# Patient Record
Sex: Female | Born: 1940 | Race: White | Hispanic: No | Marital: Married | Smoking: Never smoker
Health system: Southern US, Community
[De-identification: ages and names within clinical notes are randomized; demographics above are authoritative.]

## PROBLEM LIST (undated history)

## (undated) DIAGNOSIS — M1990 Unspecified osteoarthritis, unspecified site: Secondary | ICD-10-CM

## (undated) DIAGNOSIS — K5790 Diverticulosis of intestine, part unspecified, without perforation or abscess without bleeding: Secondary | ICD-10-CM

## (undated) DIAGNOSIS — J45909 Unspecified asthma, uncomplicated: Secondary | ICD-10-CM

## (undated) DIAGNOSIS — E039 Hypothyroidism, unspecified: Secondary | ICD-10-CM

## (undated) DIAGNOSIS — T7840XA Allergy, unspecified, initial encounter: Secondary | ICD-10-CM

## (undated) DIAGNOSIS — I739 Peripheral vascular disease, unspecified: Secondary | ICD-10-CM

## (undated) DIAGNOSIS — D126 Benign neoplasm of colon, unspecified: Secondary | ICD-10-CM

## (undated) DIAGNOSIS — M8580 Other specified disorders of bone density and structure, unspecified site: Secondary | ICD-10-CM

## (undated) DIAGNOSIS — E7800 Pure hypercholesterolemia, unspecified: Secondary | ICD-10-CM

## (undated) DIAGNOSIS — E559 Vitamin D deficiency, unspecified: Secondary | ICD-10-CM

## (undated) DIAGNOSIS — I10 Essential (primary) hypertension: Secondary | ICD-10-CM

## (undated) DIAGNOSIS — Z953 Presence of xenogenic heart valve: Secondary | ICD-10-CM

## (undated) HISTORY — PX: TONSILLECTOMY: SUR1361

## (undated) HISTORY — DX: Allergy, unspecified, initial encounter: T78.40XA

## (undated) HISTORY — DX: Pure hypercholesterolemia, unspecified: E78.00

## (undated) HISTORY — DX: Benign neoplasm of colon, unspecified: D12.6

## (undated) HISTORY — DX: Diverticulosis of intestine, part unspecified, without perforation or abscess without bleeding: K57.90

## (undated) HISTORY — PX: MULTIPLE TOOTH EXTRACTIONS: SHX2053

## (undated) HISTORY — PX: COLONOSCOPY W/ POLYPECTOMY: SHX1380

## (undated) HISTORY — DX: Unspecified osteoarthritis, unspecified site: M19.90

## (undated) HISTORY — DX: Vitamin D deficiency, unspecified: E55.9

## (undated) HISTORY — PX: FINGER SURGERY: SHX640

## (undated) HISTORY — DX: Other specified disorders of bone density and structure, unspecified site: M85.80

## (undated) HISTORY — PX: BREAST SURGERY: SHX581

## (undated) HISTORY — DX: Peripheral vascular disease, unspecified: I73.9

---

## 1998-11-17 ENCOUNTER — Encounter: Payer: Self-pay | Admitting: Obstetrics and Gynecology

## 1998-11-17 ENCOUNTER — Ambulatory Visit (HOSPITAL_COMMUNITY): Admission: RE | Admit: 1998-11-17 | Discharge: 1998-11-17 | Payer: Self-pay | Admitting: Obstetrics and Gynecology

## 1999-11-20 ENCOUNTER — Encounter: Payer: Self-pay | Admitting: Obstetrics and Gynecology

## 1999-11-20 ENCOUNTER — Ambulatory Visit (HOSPITAL_COMMUNITY): Admission: RE | Admit: 1999-11-20 | Discharge: 1999-11-20 | Payer: Self-pay | Admitting: Obstetrics and Gynecology

## 2001-01-15 ENCOUNTER — Other Ambulatory Visit: Admission: RE | Admit: 2001-01-15 | Discharge: 2001-01-15 | Payer: Self-pay | Admitting: Obstetrics and Gynecology

## 2001-01-15 ENCOUNTER — Encounter (INDEPENDENT_AMBULATORY_CARE_PROVIDER_SITE_OTHER): Payer: Self-pay | Admitting: Specialist

## 2002-06-01 ENCOUNTER — Ambulatory Visit (HOSPITAL_COMMUNITY): Admission: RE | Admit: 2002-06-01 | Discharge: 2002-06-01 | Payer: Self-pay | Admitting: Family Medicine

## 2002-06-01 ENCOUNTER — Encounter: Payer: Self-pay | Admitting: Family Medicine

## 2002-07-08 ENCOUNTER — Encounter: Payer: Self-pay | Admitting: Family Medicine

## 2002-07-08 ENCOUNTER — Ambulatory Visit (HOSPITAL_COMMUNITY): Admission: RE | Admit: 2002-07-08 | Discharge: 2002-07-08 | Payer: Self-pay | Admitting: Family Medicine

## 2007-06-01 ENCOUNTER — Encounter: Admission: RE | Admit: 2007-06-01 | Discharge: 2007-06-01 | Payer: Self-pay | Admitting: Family Medicine

## 2007-11-26 ENCOUNTER — Emergency Department: Payer: Self-pay | Admitting: Emergency Medicine

## 2007-12-04 ENCOUNTER — Emergency Department: Payer: Self-pay | Admitting: Emergency Medicine

## 2011-12-31 DIAGNOSIS — E559 Vitamin D deficiency, unspecified: Secondary | ICD-10-CM

## 2011-12-31 HISTORY — DX: Vitamin D deficiency, unspecified: E55.9

## 2012-12-30 DIAGNOSIS — M1990 Unspecified osteoarthritis, unspecified site: Secondary | ICD-10-CM

## 2012-12-30 HISTORY — DX: Unspecified osteoarthritis, unspecified site: M19.90

## 2013-08-17 ENCOUNTER — Other Ambulatory Visit: Payer: Self-pay | Admitting: Orthopedic Surgery

## 2013-08-27 ENCOUNTER — Encounter (HOSPITAL_COMMUNITY): Payer: Self-pay | Admitting: Pharmacy Technician

## 2013-08-27 NOTE — Pre-Procedure Instructions (Signed)
MARKEIA HARKLESS  08/27/2013   Your procedure is scheduled on: Monday September 06, 2013 at 7:30 AM  Report to [facility] at 5:30 AM.  Call this number if you have problems the morning of surgery: [phone]   Remember:   Do not eat food or drink liquids after midnight.   Take these medicines the morning of surgery with A SIP OF WATER: levothyroxine (SYNTHROID, LEVOTHROID)  Stop all Vitamins, Herbal Medications, Aspirin, and Non Steroidals (Lodine(Etodolac), Ibuprofen, Motrin, Aleve, Naproxen) as of today - 08/31/13   Do not wear jewelry, make-up or nail polish.  Do not wear lotions, powders, or perfumes. You may wear deodorant.  Do not shave 48 hours prior to surgery.   Do not bring valuables to the hospital.  [facility] is not responsible for any belongings or valuables.  Contacts, dentures or bridgework may not be worn into surgery.  Leave suitcase in the car. After surgery it may be brought to your room.  For patients admitted to the hospital, checkout time is 11:00 AM the day of discharge.    Special Instructions: Shower using CHG 2 nights before surgery and the night before surgery.  If you shower the day of surgery use CHG.  Use special wash - you have one bottle of CHG for all showers.  You should use approximately 1/3 of the bottle for each shower.   Please read over the following fact sheets that you were given: Pain Booklet, Coughing and Deep Breathing, Blood Transfusion Information, Total Joint Packet, MRSA Information and Surgical Site Infection Prevention

## 2013-08-31 ENCOUNTER — Ambulatory Visit (HOSPITAL_COMMUNITY)
Admission: RE | Admit: 2013-08-31 | Discharge: 2013-08-31 | Disposition: A | Discharge status: Home / Self Care | Payer: Medicare Other | Source: Ambulatory Visit | Attending: Orthopedic Surgery | Admitting: Orthopedic Surgery

## 2013-08-31 ENCOUNTER — Encounter (HOSPITAL_COMMUNITY)
Admission: RE | Admit: 2013-08-31 | Discharge: 2013-08-31 | Disposition: A | Discharge status: Home / Self Care | Payer: Medicare Other | Source: Ambulatory Visit | Attending: Orthopedic Surgery | Admitting: Orthopedic Surgery

## 2013-08-31 ENCOUNTER — Encounter (HOSPITAL_COMMUNITY): Payer: Self-pay

## 2013-08-31 DIAGNOSIS — Z01812 Encounter for preprocedural laboratory examination: Secondary | ICD-10-CM | POA: Insufficient documentation

## 2013-08-31 DIAGNOSIS — I700 Atherosclerosis of aorta: Secondary | ICD-10-CM | POA: Insufficient documentation

## 2013-08-31 DIAGNOSIS — M169 Osteoarthritis of hip, unspecified: Secondary | ICD-10-CM | POA: Insufficient documentation

## 2013-08-31 DIAGNOSIS — I498 Other specified cardiac arrhythmias: Secondary | ICD-10-CM | POA: Insufficient documentation

## 2013-08-31 DIAGNOSIS — Z01810 Encounter for preprocedural cardiovascular examination: Secondary | ICD-10-CM | POA: Insufficient documentation

## 2013-08-31 DIAGNOSIS — M1610 Unilateral primary osteoarthritis, unspecified hip: Secondary | ICD-10-CM | POA: Insufficient documentation

## 2013-08-31 HISTORY — DX: Essential (primary) hypertension: I10

## 2013-08-31 HISTORY — DX: Unspecified osteoarthritis, unspecified site: M19.90

## 2013-08-31 HISTORY — DX: Hypothyroidism, unspecified: E03.9

## 2013-08-31 HISTORY — DX: Unspecified asthma, uncomplicated: J45.909

## 2013-08-31 LAB — CBC WITH DIFFERENTIAL/PLATELET
Basophils Relative: 1 % (ref 0–1)
Eosinophils Absolute: 0.1 10*3/uL (ref 0.0–0.7)
HCT: 40.9 % (ref 36.0–46.0)
Hemoglobin: 14.3 g/dL (ref 12.0–15.0)
Lymphs Abs: 1 10*3/uL (ref 0.7–4.0)
MCH: 32.1 pg (ref 26.0–34.0)
MCHC: 35 g/dL (ref 30.0–36.0)
Monocytes Absolute: 0.5 10*3/uL (ref 0.1–1.0)
Monocytes Relative: 8 % (ref 3–12)
Neutro Abs: 4.9 10*3/uL (ref 1.7–7.7)
Neutrophils Relative %: 74 % (ref 43–77)
RBC: 4.46 MIL/uL (ref 3.87–5.11)

## 2013-08-31 LAB — URINALYSIS, ROUTINE W REFLEX MICROSCOPIC
Glucose, UA: NEGATIVE mg/dL
Ketones, ur: NEGATIVE mg/dL
Leukocytes, UA: NEGATIVE
Nitrite: NEGATIVE
Specific Gravity, Urine: 1.008 (ref 1.005–1.030)
pH: 7 (ref 5.0–8.0)

## 2013-08-31 LAB — BASIC METABOLIC PANEL
BUN: 14 mg/dL (ref 6–23)
Chloride: 100 mEq/L (ref 96–112)
Glucose, Bld: 100 mg/dL — ABNORMAL HIGH (ref 70–99)
Potassium: 3.6 mEq/L (ref 3.5–5.1)
Sodium: 138 mEq/L (ref 135–145)

## 2013-08-31 LAB — PROTIME-INR: INR: 0.99 (ref 0.00–1.49)

## 2013-08-31 LAB — ABO/RH: ABO/RH(D): O POS

## 2013-08-31 LAB — TYPE AND SCREEN: Antibody Screen: NEGATIVE

## 2013-08-31 LAB — SURGICAL PCR SCREEN: MRSA, PCR: NEGATIVE

## 2013-08-31 NOTE — Progress Notes (Signed)
Patient denied having any cardiac or pulmonary issues. PCP is Dr. Robina Ade at [facility] at [place].

## 2013-09-01 NOTE — Consult Note (Signed)
Anesthesiology Chart Review:  72 year female with hypertension on HCTZ. Denies DM or heart disease. Scheduled for R. THR on 09/06/13 by Dr Turner Daniels. Pre-op ECG shows possible loss of R wave progression in lead V3. I suspect that this is a lead placement artifact. Will repeat ECG on AM of surgery.  She will be evaluated by her assigned anesthesiologist on the day of surgery.  Kipp Brood, MD

## 2013-09-03 NOTE — H&P (Signed)
Cheyenne Jones is an 72 y.o. female.   Chief Complaint:  Right Hip Pain  HPI: Patient reports that the cortisone injection given by Dr. Regino Jones into the right hip 2 weeks ago lasted for only an hour or two.  She still has a profound right-sided limp.  Pain wakes her at night and interferes with doing chores around the house and at age 32 she has had some near falling episodes.  Her x-rays have shown moderate to severe arthritis of loss of articular cartilage height to the right hip, and impressive drop osteophyte and near bone-on-bone to the medial pole.  Past Medical History  Diagnosis Date  . Hypertension   . Bronchitis, asthmatic   . Hypothyroidism   . Arthritis     Past Surgical History  Procedure Laterality Date  . Tonsillectomy    . Breast surgery Right     x 2  . Finger surgery Right     index finger  . Colonoscopy w/ polypectomy      No family history on file. Social History:  reports that she has never smoked. She does not have any smokeless tobacco history on file. She reports that she drinks about 1.2 ounces of alcohol per week. She reports that she does not use illicit drugs.  Allergies: No Known Allergies  No prescriptions prior to admission    No results found for this or any previous visit (from the past 48 hour(s)). No results found.  Review of Systems  Constitutional: Negative.   HENT: Negative.   Eyes: Positive for blurred vision (pt wears contacts).  Cardiovascular: Negative.   Gastrointestinal: Negative.   Genitourinary: Negative.   Musculoskeletal: Positive for joint pain (right hip pain).  Skin: Negative.   Neurological: Negative.   Endo/Heme/Allergies: Negative.   Psychiatric/Behavioral: Negative.     There were no vitals taken for this visit. Physical Exam  Constitutional: She is oriented to person, place, and time. She appears well-developed and well-nourished.  HENT:  Head: Normocephalic and atraumatic.  Neck: Normal range of motion. Neck  supple.  Cardiovascular: Intact distal pulses.   Respiratory: Effort normal and breath sounds normal.  Neurological: She is alert and oriented to person, place, and time. She has normal reflexes.  Skin: Skin is warm and dry.  Psychiatric: She has a normal mood and affect. Her behavior is normal. Judgment and thought content normal.     Assessment/Plan Assess: End-stage arthritis of the right hip having failed all conservative measures including a cortisone and anesthetic shot gave only an hour to his worth of pain relief and a 72 year old woman.  She is having some near falling episodes.  Plan: The risks and benefits of hip arthroplasty were discussed at length.  We'll proceed with hip replacement using an S-ROM stem, which will provide the ability for full weightbearing immediately [name].  I will see her back at the time of surgical intervention.  She is still taking 1 or 2 hydrocodone a day which is reasonable.  She may return sooner she has any increased pain.  Cheyenne Jones R 09/03/2013, 1:15 PM

## 2013-09-05 DIAGNOSIS — M1610 Unilateral primary osteoarthritis, unspecified hip: Secondary | ICD-10-CM | POA: Diagnosis present

## 2013-09-05 MED ORDER — CEFAZOLIN SODIUM-DEXTROSE 2-3 GM-% IV SOLR
2.0000 g | INTRAVENOUS | Status: AC
  Administered 2013-09-06: 2 g via INTRAVENOUS
  Filled 2013-09-05: qty 50

## 2013-09-06 ENCOUNTER — Inpatient Hospital Stay (HOSPITAL_COMMUNITY)
Admission: RE | Admit: 2013-09-06 | Discharge: 2013-09-08 | DRG: 470 | Disposition: A | Discharge status: Skilled Nursing Facility | Payer: Medicare Other | Source: Ambulatory Visit | Attending: Orthopedic Surgery | Admitting: Orthopedic Surgery

## 2013-09-06 ENCOUNTER — Encounter (HOSPITAL_COMMUNITY): Payer: Self-pay | Admitting: *Deleted

## 2013-09-06 ENCOUNTER — Encounter (HOSPITAL_COMMUNITY): Payer: Self-pay | Admitting: Anesthesiology

## 2013-09-06 ENCOUNTER — Encounter (HOSPITAL_COMMUNITY)
Admission: RE | Disposition: A | Discharge status: Skilled Nursing Facility | Payer: Self-pay | Source: Ambulatory Visit | Attending: Orthopedic Surgery

## 2013-09-06 ENCOUNTER — Inpatient Hospital Stay (HOSPITAL_COMMUNITY): Payer: Medicare Other | Admitting: Anesthesiology

## 2013-09-06 ENCOUNTER — Inpatient Hospital Stay (HOSPITAL_COMMUNITY): Payer: Medicare Other

## 2013-09-06 DIAGNOSIS — Z7982 Long term (current) use of aspirin: Secondary | ICD-10-CM

## 2013-09-06 DIAGNOSIS — I10 Essential (primary) hypertension: Secondary | ICD-10-CM | POA: Diagnosis present

## 2013-09-06 DIAGNOSIS — Z79899 Other long term (current) drug therapy: Secondary | ICD-10-CM

## 2013-09-06 DIAGNOSIS — Z23 Encounter for immunization: Secondary | ICD-10-CM

## 2013-09-06 DIAGNOSIS — M169 Osteoarthritis of hip, unspecified: Principal | ICD-10-CM | POA: Diagnosis present

## 2013-09-06 DIAGNOSIS — M1610 Unilateral primary osteoarthritis, unspecified hip: Principal | ICD-10-CM | POA: Diagnosis present

## 2013-09-06 DIAGNOSIS — E039 Hypothyroidism, unspecified: Secondary | ICD-10-CM | POA: Diagnosis present

## 2013-09-06 HISTORY — PX: TOTAL HIP ARTHROPLASTY: SHX124

## 2013-09-06 SURGERY — ARTHROPLASTY, HIP, TOTAL,POSTERIOR APPROACH
Anesthesia: General | Site: Hip | Laterality: Right | Wound class: Clean

## 2013-09-06 MED ORDER — PROPOFOL 10 MG/ML IV BOLUS
INTRAVENOUS | Status: DC | PRN
  Administered 2013-09-06: 100 mg via INTRAVENOUS

## 2013-09-06 MED ORDER — INFLUENZA VIRUS VACC SPLIT PF IM SUSP: 0.5000 mL | INTRAMUSCULAR | Status: DC

## 2013-09-06 MED ORDER — DEXTROSE-NACL 5-0.45 % IV SOLN: INTRAVENOUS | Status: DC

## 2013-09-06 MED ORDER — TRANEXAMIC ACID 100 MG/ML IV SOLN
1000.0000 mg | INTRAVENOUS | Status: AC
  Administered 2013-09-06: 1000 mg via INTRAVENOUS
  Filled 2013-09-06: qty 10

## 2013-09-06 MED ORDER — ARTIFICIAL TEARS OP OINT
TOPICAL_OINTMENT | OPHTHALMIC | Status: DC | PRN
  Administered 2013-09-06: 1 via OPHTHALMIC

## 2013-09-06 MED ORDER — FLEET ENEMA 7-19 GM/118ML RE ENEM: 1.0000 | ENEMA | Freq: Once | RECTAL | Status: AC | PRN

## 2013-09-06 MED ORDER — ROCURONIUM BROMIDE 100 MG/10ML IV SOLN
INTRAVENOUS | Status: DC | PRN
  Administered 2013-09-06: 50 mg via INTRAVENOUS

## 2013-09-06 MED ORDER — SENNOSIDES-DOCUSATE SODIUM 8.6-50 MG PO TABS: 1.0000 | ORAL_TABLET | Freq: Every evening | ORAL | Status: DC | PRN

## 2013-09-06 MED ORDER — NEOSTIGMINE METHYLSULFATE 1 MG/ML IJ SOLN
INTRAMUSCULAR | Status: DC | PRN
  Administered 2013-09-06: 4 mg via INTRAVENOUS

## 2013-09-06 MED ORDER — PHENOL 1.4 % MT LIQD: 1.0000 | OROMUCOSAL | Status: DC | PRN

## 2013-09-06 MED ORDER — TIZANIDINE HCL 2 MG PO TABS: 2.0000 mg | ORAL_TABLET | Freq: Four times a day (QID) | ORAL | Status: DC | PRN

## 2013-09-06 MED ORDER — CHLORHEXIDINE GLUCONATE 4 % EX LIQD: 60.0000 mL | Freq: Once | CUTANEOUS | Status: DC

## 2013-09-06 MED ORDER — LEVOTHYROXINE SODIUM 88 MCG PO TABS
88.0000 ug | ORAL_TABLET | Freq: Every day | ORAL | Status: DC
  Administered 2013-09-07 – 2013-09-08 (×2): 88 ug via ORAL
  Filled 2013-09-06 (×3): qty 1

## 2013-09-06 MED ORDER — OXYCODONE-ACETAMINOPHEN 5-325 MG PO TABS: 1.0000 | ORAL_TABLET | ORAL | Status: DC | PRN

## 2013-09-06 MED ORDER — BUPIVACAINE-EPINEPHRINE (PF) 0.5% -1:200000 IJ SOLN
INTRAMUSCULAR | Status: AC
  Filled 2013-09-06: qty 10

## 2013-09-06 MED ORDER — GLYCOPYRROLATE 0.2 MG/ML IJ SOLN
INTRAMUSCULAR | Status: DC | PRN
  Administered 2013-09-06: 0.6 mg via INTRAVENOUS

## 2013-09-06 MED ORDER — HYDROMORPHONE HCL PF 1 MG/ML IJ SOLN
0.5000 mg | INTRAMUSCULAR | Status: DC | PRN
  Administered 2013-09-06 (×3): 0.5 mg via INTRAVENOUS
  Filled 2013-09-06 (×3): qty 1

## 2013-09-06 MED ORDER — METHOCARBAMOL 100 MG/ML IJ SOLN
500.0000 mg | Freq: Once | INTRAVENOUS | Status: AC
  Administered 2013-09-06: 500 mg via INTRAVENOUS
  Filled 2013-09-06: qty 5

## 2013-09-06 MED ORDER — METHOCARBAMOL 100 MG/ML IJ SOLN
500.0000 mg | Freq: Four times a day (QID) | INTRAVENOUS | Status: DC | PRN
  Filled 2013-09-06: qty 5

## 2013-09-06 MED ORDER — INFLUENZA VAC SPLIT QUAD 0.5 ML IM SUSP
0.5000 mL | INTRAMUSCULAR | Status: AC
  Filled 2013-09-06 (×2): qty 0.5

## 2013-09-06 MED ORDER — SODIUM CHLORIDE 0.9 % IR SOLN
Status: DC | PRN
  Administered 2013-09-06: 1000 mL

## 2013-09-06 MED ORDER — OXYCODONE HCL 5 MG PO TABS
5.0000 mg | ORAL_TABLET | ORAL | Status: DC | PRN
  Administered 2013-09-07 – 2013-09-08 (×4): 10 mg via ORAL
  Filled 2013-09-06 (×4): qty 2

## 2013-09-06 MED ORDER — METOCLOPRAMIDE HCL 5 MG/ML IJ SOLN: 5.0000 mg | Freq: Three times a day (TID) | INTRAMUSCULAR | Status: DC | PRN

## 2013-09-06 MED ORDER — DIPHENHYDRAMINE HCL 12.5 MG/5ML PO ELIX: 12.5000 mg | ORAL_SOLUTION | ORAL | Status: DC | PRN

## 2013-09-06 MED ORDER — HYDROMORPHONE HCL PF 1 MG/ML IJ SOLN
INTRAMUSCULAR | Status: AC
  Filled 2013-09-06: qty 1

## 2013-09-06 MED ORDER — METHOCARBAMOL 500 MG PO TABS: 500.0000 mg | ORAL_TABLET | Freq: Four times a day (QID) | ORAL | Status: DC | PRN

## 2013-09-06 MED ORDER — HYDROMORPHONE HCL PF 1 MG/ML IJ SOLN
0.2500 mg | INTRAMUSCULAR | Status: DC | PRN
  Administered 2013-09-06 (×4): 0.5 mg via INTRAVENOUS

## 2013-09-06 MED ORDER — ACETAMINOPHEN 325 MG PO TABS: 650.0000 mg | ORAL_TABLET | Freq: Four times a day (QID) | ORAL | Status: DC | PRN

## 2013-09-06 MED ORDER — ONDANSETRON HCL 4 MG PO TABS: 4.0000 mg | ORAL_TABLET | Freq: Four times a day (QID) | ORAL | Status: DC | PRN

## 2013-09-06 MED ORDER — LIDOCAINE HCL (CARDIAC) 20 MG/ML IV SOLN
INTRAVENOUS | Status: DC | PRN
  Administered 2013-09-06: 50 mg via INTRAVENOUS

## 2013-09-06 MED ORDER — FENTANYL CITRATE 0.05 MG/ML IJ SOLN
INTRAMUSCULAR | Status: DC | PRN
  Administered 2013-09-06: 100 ug via INTRAVENOUS
  Administered 2013-09-06 (×2): 50 ug via INTRAVENOUS

## 2013-09-06 MED ORDER — ONDANSETRON HCL 4 MG/2ML IJ SOLN: 4.0000 mg | Freq: Four times a day (QID) | INTRAMUSCULAR | Status: DC | PRN

## 2013-09-06 MED ORDER — METOCLOPRAMIDE HCL 10 MG PO TABS: 5.0000 mg | ORAL_TABLET | Freq: Three times a day (TID) | ORAL | Status: DC | PRN

## 2013-09-06 MED ORDER — DOCUSATE SODIUM 100 MG PO CAPS
100.0000 mg | ORAL_CAPSULE | Freq: Two times a day (BID) | ORAL | Status: DC
  Administered 2013-09-06 – 2013-09-08 (×5): 100 mg via ORAL
  Filled 2013-09-06 (×6): qty 1

## 2013-09-06 MED ORDER — MENTHOL 3 MG MT LOZG: 1.0000 | LOZENGE | OROMUCOSAL | Status: DC | PRN

## 2013-09-06 MED ORDER — HYDROCHLOROTHIAZIDE 25 MG PO TABS
25.0000 mg | ORAL_TABLET | Freq: Every day | ORAL | Status: DC
  Administered 2013-09-06 – 2013-09-08 (×3): 25 mg via ORAL
  Filled 2013-09-06 (×4): qty 1

## 2013-09-06 MED ORDER — ONDANSETRON HCL 4 MG/2ML IJ SOLN
INTRAMUSCULAR | Status: DC | PRN
  Administered 2013-09-06: 4 mg via INTRAVENOUS

## 2013-09-06 MED ORDER — BISACODYL 5 MG PO TBEC: 5.0000 mg | DELAYED_RELEASE_TABLET | Freq: Every day | ORAL | Status: DC | PRN

## 2013-09-06 MED ORDER — ACETAMINOPHEN 650 MG RE SUPP: 650.0000 mg | Freq: Four times a day (QID) | RECTAL | Status: DC | PRN

## 2013-09-06 MED ORDER — KCL IN DEXTROSE-NACL 20-5-0.45 MEQ/L-%-% IV SOLN
INTRAVENOUS | Status: DC
  Administered 2013-09-06: 16:00:00 via INTRAVENOUS
  Administered 2013-09-07: 1000 mL via INTRAVENOUS
  Administered 2013-09-07: 08:00:00 via INTRAVENOUS
  Filled 2013-09-06 (×9): qty 1000

## 2013-09-06 MED ORDER — ASPIRIN EC 325 MG PO TBEC
325.0000 mg | DELAYED_RELEASE_TABLET | Freq: Every day | ORAL | Status: DC
  Administered 2013-09-07 – 2013-09-08 (×2): 325 mg via ORAL
  Filled 2013-09-06 (×3): qty 1

## 2013-09-06 MED ORDER — LACTATED RINGERS IV SOLN
INTRAVENOUS | Status: DC | PRN
  Administered 2013-09-06: 07:00:00 via INTRAVENOUS

## 2013-09-06 MED ORDER — BUPIVACAINE-EPINEPHRINE 0.5% -1:200000 IJ SOLN
INTRAMUSCULAR | Status: DC | PRN
  Administered 2013-09-06: 18 mL

## 2013-09-06 SURGICAL SUPPLY — 57 items
BLADE SAW SGTL 18X1.27X75 (BLADE) ×2 IMPLANT
BRUSH FEMORAL CANAL (MISCELLANEOUS) IMPLANT
CAPT HIP PF MOP ×1 IMPLANT
CLOTH BEACON ORANGE TIMEOUT ST (SAFETY) ×2 IMPLANT
COVER BACK TABLE 24X17X13 BIG (DRAPES) IMPLANT
COVER SURGICAL LIGHT HANDLE (MISCELLANEOUS) ×3 IMPLANT
DRAPE ORTHO SPLIT 77X108 STRL (DRAPES) ×2
DRAPE PROXIMA HALF (DRAPES) ×2 IMPLANT
DRAPE SURG ORHT 6 SPLT 77X108 (DRAPES) ×1 IMPLANT
DRAPE U-SHAPE 47X51 STRL (DRAPES) ×2 IMPLANT
DRILL BIT 7/64X5 (BIT) ×2 IMPLANT
DRSG AQUACEL AG ADV 3.5X10 (GAUZE/BANDAGES/DRESSINGS) ×1 IMPLANT
DRSG MEPILEX BORDER 4X12 (GAUZE/BANDAGES/DRESSINGS) ×1 IMPLANT
DRSG MEPILEX BORDER 4X8 (GAUZE/BANDAGES/DRESSINGS) ×2 IMPLANT
DURAPREP 26ML APPLICATOR (WOUND CARE) ×2 IMPLANT
ELECT BLADE 4.0 EZ CLEAN MEGAD (MISCELLANEOUS)
ELECT REM PT RETURN 9FT ADLT (ELECTROSURGICAL) ×2
ELECTRODE BLDE 4.0 EZ CLN MEGD (MISCELLANEOUS) IMPLANT
ELECTRODE REM PT RTRN 9FT ADLT (ELECTROSURGICAL) ×1 IMPLANT
GAUZE XEROFORM 1X8 LF (GAUZE/BANDAGES/DRESSINGS) ×2 IMPLANT
GLOVE BIO SURGEON STRL SZ7.5 (GLOVE) ×4 IMPLANT
GLOVE BIO SURGEON STRL SZ8.5 (GLOVE) ×4 IMPLANT
GLOVE BIOGEL PI IND STRL 7.0 (GLOVE) IMPLANT
GLOVE BIOGEL PI IND STRL 8 (GLOVE) ×2 IMPLANT
GLOVE BIOGEL PI IND STRL 9 (GLOVE) ×1 IMPLANT
GLOVE BIOGEL PI INDICATOR 7.0 (GLOVE) ×2
GLOVE BIOGEL PI INDICATOR 8 (GLOVE) ×4
GLOVE BIOGEL PI INDICATOR 9 (GLOVE) ×1
GLOVE SURG SS PI 6.5 STRL IVOR (GLOVE) ×1 IMPLANT
GOWN PREVENTION PLUS XLARGE (GOWN DISPOSABLE) ×4 IMPLANT
GOWN STRL NON-REIN LRG LVL3 (GOWN DISPOSABLE) ×4 IMPLANT
GOWN STRL REIN XL XLG (GOWN DISPOSABLE) ×4 IMPLANT
HANDPIECE INTERPULSE COAX TIP (DISPOSABLE)
HOOD PEEL AWAY FACE SHEILD DIS (HOOD) ×5 IMPLANT
KIT BASIN OR (CUSTOM PROCEDURE TRAY) ×2 IMPLANT
KIT ROOM TURNOVER OR (KITS) ×2 IMPLANT
MANIFOLD NEPTUNE II (INSTRUMENTS) ×2 IMPLANT
NEEDLE 22X1 1/2 (OR ONLY) (NEEDLE) ×2 IMPLANT
NS IRRIG 1000ML POUR BTL (IV SOLUTION) ×2 IMPLANT
PACK TOTAL JOINT (CUSTOM PROCEDURE TRAY) ×2 IMPLANT
PAD ARMBOARD 7.5X6 YLW CONV (MISCELLANEOUS) ×4 IMPLANT
PASSER SUT SWANSON 36MM LOOP (INSTRUMENTS) ×2 IMPLANT
PRESSURIZER FEMORAL UNIV (MISCELLANEOUS) IMPLANT
SET HNDPC FAN SPRY TIP SCT (DISPOSABLE) IMPLANT
SUT ETHIBOND 2 V 37 (SUTURE) ×2 IMPLANT
SUT ETHILON 3 0 FSL (SUTURE) ×2 IMPLANT
SUT VIC AB 0 CTB1 27 (SUTURE) ×2 IMPLANT
SUT VIC AB 1 CTX 36 (SUTURE) ×2
SUT VIC AB 1 CTX36XBRD ANBCTR (SUTURE) ×1 IMPLANT
SUT VIC AB 2-0 CTB1 (SUTURE) ×2 IMPLANT
SYR CONTROL 10ML LL (SYRINGE) ×2 IMPLANT
TOWEL OR 17X24 6PK STRL BLUE (TOWEL DISPOSABLE) ×2 IMPLANT
TOWEL OR 17X26 10 PK STRL BLUE (TOWEL DISPOSABLE) ×2 IMPLANT
TOWER CARTRIDGE SMART MIX (DISPOSABLE) IMPLANT
TRAY FOLEY CATH 14FR (SET/KITS/TRAYS/PACK) ×1 IMPLANT
TRAY FOLEY CATH 16FRSI W/METER (SET/KITS/TRAYS/PACK) IMPLANT
WATER STERILE IRR 1000ML POUR (IV SOLUTION) ×8 IMPLANT

## 2013-09-06 NOTE — Op Note (Signed)
OPERATIVE REPORT    DATE OF PROCEDURE:  09/06/2013       PREOPERATIVE DIAGNOSIS:  OSTEOARTHRITIS RIGHT HIP                                                          POSTOPERATIVE DIAGNOSIS:  OSTEOARTHRITIS RIGHT HIP                                                           PROCEDURE:  R total hip arthroplasty using a 50 mm DePuy Pinnacle  Cup, [employer], 10-degree polyethylene liner index superior  and posterior, a +0 32 mm ceramic head, a 16x11x36x150 SROM stem, 16BL Sleeve   SURGEON: Yuta Cipollone J    ASSISTANT:   Eric K. [employer]  (present throughout entire procedure and necessary for timely completion of the procedure)   ANESTHESIA: General BLOOD LOSS: 400 FLUID REPLACEMENT: 1500 crystalloid DRAINS: Foley Catheter URINE OUTPUT: 300cc COMPLICATIONS: none    INDICATIONS FOR PROCEDURE: A 72 y.o. year-old With  OSTEOARTHRITIS RIGHT HIP   for 2 years, x-rays show bone-on-bone arthritic changes. Despite conservative measures with observation, anti-inflammatory medicine, narcotics, use of a cane, has severe unremitting pain and can ambulate only a few blocks before resting.  Patient desires elective R total hip arthroplasty to decrease pain and increase function. The risks, benefits, and alternatives were discussed at length including but not limited to the risks of infection, bleeding, nerve injury, stiffness, blood clots, the need for revision surgery, cardiopulmonary complications, among others, and they were willing to proceed. Questions answered     PROCEDURE IN DETAIL: The patient was identified by armband,  received preoperative IV antibiotics in the holding area at [facility], taken to the operating room , appropriate anesthetic monitors  were attached and general endotracheal anesthesia induced. Foley catheter was inserted. Pt was rolled into the L lateral decubitus position and fixed there with a Stulberg Mark II pelvic clamp.  The R lower extremity was  then prepped and draped  in the usual sterile fashion from the ankle to the hemipelvis. A time-out  procedure was performed. The skin along the lateral hip and thigh  infiltrated with 10 mL of 0.5% Marcaine and epinephrine solution. We  then made a posterolateral approach to the hip. With a #10 blade, a 15 cm  incision was made through the skin and subcutaneous tissue down to the level of the  IT band. Small bleeders were identified and cauterized. The IT band was cut in  line with skin incision exposing the greater trochanter. A Cobra retractor was placed between the gluteus minimus and the superior hip joint capsule, and a spiked Cobra between the quadratus femoris and the inferior hip joint capsule. This isolated the short  external rotators and piriformis tendons. These were tagged with a #2 Ethibond  suture and cut off their insertion on the intertrochanteric crest. The posterior  capsule was then developed into an acetabular-based flap from Posterior Superior off of the acetabulum out over the femoral neck and back posterior inferior to the acetabular rim. This flap was tagged with two #2 Ethibond  sutures and retracted protecting the sciatic nerve. This exposed the arthritic femoral head and osteophytes. The hip was then flexed and internally rotated, dislocating the femoral head and a standard neck cut performed 1 fingerbreadth above the lesser trochanter.  A spiked Cobra was placed in the cotyloid notch and a Hohmann retractor was then used to lever the femur anteriorly off of the anterior pelvic column. A posterior-inferior wing retractor was placed at the junction of the acetabulum and the ischium completing the acetabular exposure.We then removed the peripheral osteophytes and labrum from the acetabulum. We then reamed the acetabulum up to 49 mm with basket reamers obtaining good coverage in all quadrants. We then irrigated with normal  saline solution and hammered into place a 50 mm pinnacle  cup in 45  degrees of abduction and about 20 degrees of anteversion. More  peripheral osteophytes removed and a trial 10-degree liner placed with the  index superior-posterior. The hip was then flexed and internally rotated exposing the  proximal femur, which was entered with the initiating reamer followed by  the axial reamers up to a 11.5 mm full depth and 12mm partial depth. We then conically reamed to 16B to the correct depth for a 42 base neck. The calcar was milled to 16BL. A trial cone and stem was inserted in the 25 degrees anteversion, with a +0 32mm trial head. Trial reduction was then performed and excellent stability was noted with at 90 of flexion with 75 of internal rotation and then full extension with maximal external rotation. The hip could not be dislocated in full extension. The knee could easily flex  to about 130 degrees. We also stretched the abductors at this point,  because of the preexisting adductor contractures. All trial components  were then removed. The acetabulum was irrigated out with normal saline  solution. A titanium Apex [facility] was then screwed into place  followed by a 10-degree polyethylene liner index superior-posterior. On  the femoral side a 16BL ZTT1 sleeve was hammered into place, followed by a 16BL SROM stem in 25 degrees of anteversion. At this point, a +0 32 mm ceramic head was  hammered on the stem. The hip was reduced. We checked our stability  one more time and found it to be excellent. The wound was once again  thoroughly irrigated out with normal saline solution pulse lavage. The  capsular flap and short external rotators were repaired back to the  intertrochanteric crest through drill holes with a #2 Ethibond suture.  The IT band was closed with running 1 Vicryl suture. The subcutaneous  tissue with 0 and 2-0 undyed Vicryl suture and the skin with running  interlocking 3-0 nylon suture. Dressing of Xeroform and Mepilex was  then  applied. The patient was then unclamped, rolled supine, awaken extubated and taken to recovery room without difficulty in stable condition.   Itai Barbian J 09/06/2013, 9:05 AM

## 2013-09-06 NOTE — Anesthesia Postprocedure Evaluation (Signed)
  Anesthesia Post-op Note  Patient: Cheyenne Jones  Procedure(s) Performed: Procedure(s): TOTAL HIP ARTHROPLASTY (Right)  Patient Location: PACU  Anesthesia Type:General  Level of Consciousness: awake and alert   Airway and Oxygen Therapy: Patient Spontanous Breathing  Post-op Pain: mild  Post-op Assessment: Post-op Vital signs reviewed, Patient's Cardiovascular Status Stable, Respiratory Function Stable, Patent Airway and No signs of Nausea or vomiting  Post-op Vital Signs: Reviewed and stable  Complications: No apparent anesthesia complications

## 2013-09-06 NOTE — Evaluation (Signed)
Physical Therapy Evaluation Patient Details Name: Cheyenne Jones MRN: [id number] DOB: Apr 24, 1941 Today's Date: 09/06/2013 Time: 8295-6213 PT Time Calculation (min): 23 min  PT Assessment / Plan / Recommendation History of Present Illness  Patient is a 72 yo female s/p Rt. THA.  Clinical Impression  Patient presents with problems listed below.  Will benefit from acute PT to maximize independence prior to discharge.      PT Assessment  Patient needs continued PT services    Follow Up Recommendations  SNF    Does the patient have the potential to tolerate intense rehabilitation      Barriers to Discharge Inaccessible home environment Lives in split-level home with bed/bath up 4-5 steps.    Equipment Recommendations  3in1 (PT)    Recommendations for Other Services     Frequency 7X/week    Precautions / Restrictions Precautions Precautions: Posterior Hip Precaution Booklet Issued: Yes (comment) Precaution Comments: Reviewed posterior hip precautions with patient and daughter. Restrictions Weight Bearing Restrictions: Yes RLE Weight Bearing: Weight bearing as tolerated   Pertinent Vitals/Pain       Mobility  Bed Mobility Bed Mobility: Supine to Sit;Sitting - Scoot to Edge of Bed Supine to Sit: 3: Mod assist;HOB flat Sitting - Scoot to Edge of Bed: 4: Min assist;With rail Details for Bed Mobility Assistance: Verbal cues for technique to maintain hip precautions.  Assist to move RLE off of bed and to raise trunk to sitting. Transfers Transfers: Sit to Stand;Stand to [employer] Transfers Sit to Stand: 4: Min assist;With upper extremity assist;From bed Stand to Sit: 4: Min assist;With upper extremity assist;With armrests;To chair/3-in-1 Stand Pivot Transfers: 4: Min assist Details for Transfer Assistance: Verbal cues for hand placement, and placement of RLE during transitions.  Assist to rise to standing and for balance.  Patient able to take several steps to pivot to  chair - cues to use UE's on RW to step with LLE. Ambulation/Gait Ambulation/Gait Assistance: Not tested (comment)    Exercises Total Joint Exercises Ankle Circles/Pumps: AROM;Both;10 reps;Seated   PT Diagnosis: Difficulty walking;Generalized weakness;Acute pain  PT Problem List: Decreased strength;Decreased range of motion;Decreased activity tolerance;Decreased balance;Decreased mobility;Decreased knowledge of use of DME;Decreased knowledge of precautions;Pain PT Treatment Interventions: DME instruction;Gait training;Functional mobility training;Therapeutic exercise;Patient/family education     PT Goals(Current goals can be found in the care plan section) Acute Rehab PT Goals Patient Stated Goal: To get stronger PT Goal Formulation: With patient/family Time For Goal Achievement: 09/13/13 Potential to Achieve Goals: Good  Visit Information  Last PT Received On: 09/06/13 Assistance Needed: +1 History of Present Illness: Patient is a 72 yo female s/p Rt. THA.       Prior Functioning  Home Living Family/patient expects to be discharged to:: Skilled nursing facility [facility]) Living Arrangements: Spouse/significant other Home Equipment: Dan Humphreys - 2 wheels Prior Function Level of Independence: Independent Communication Communication: No difficulties    Cognition  Cognition Arousal/Alertness: Awake/alert Behavior During Therapy: WFL for tasks assessed/performed Overall Cognitive Status: Within Functional Limits for tasks assessed    Extremity/Trunk Assessment Upper Extremity Assessment Upper Extremity Assessment: Overall WFL for tasks assessed Lower Extremity Assessment Lower Extremity Assessment: RLE deficits/detail RLE Deficits / Details: Decreased strength and ROM due to surgery and pain. RLE: Unable to fully assess due to pain Cervical / Trunk Assessment Cervical / Trunk Assessment: Normal   Balance Balance Balance Assessed: Yes Static Sitting Balance Static  Sitting - Balance Support: No upper extremity supported;Feet supported Static Sitting - Level of Assistance:  5: Stand by assistance Static Sitting - Comment/# of Minutes: 2  End of Session PT - End of Session Equipment Utilized During Treatment: Gait belt;Oxygen Activity Tolerance: Patient limited by fatigue Patient left: in chair;with call bell/phone within reach;with family/visitor present Nurse Communication: Mobility status  GP     Vena Austria 09/06/2013, 4:49 PM Durenda Hurt. Renaldo Fiddler, [facility] Acute Rehab Services Pager [phone]

## 2013-09-06 NOTE — Interval H&P Note (Signed)
History and Physical Interval Note:  09/06/2013 7:21 AM  Cheyenne Jones  has presented today for surgery, with the diagnosis of OSTEOARTHRITIS RIGHT HIP  The various methods of treatment have been discussed with the patient and family. After consideration of risks, benefits and other options for treatment, the patient has consented to  Procedure(s): TOTAL HIP ARTHROPLASTY (Right) as a surgical intervention .  The patient's history has been reviewed, patient examined, no change in status, stable for surgery.  I have reviewed the patient's chart and labs.  Questions were answered to the patient's satisfaction.     Nestor Lewandowsky

## 2013-09-06 NOTE — Transfer of Care (Signed)
Immediate An esthesia Transfer of Care Note  Patient: Cheyenne Jones  Procedure(s) Performed: Procedure(s): TOTAL HIP ARTHROPLASTY (Right)  Patient Location: PACU  Anesthesia Type:General  Level of Consciousness: awake, alert , oriented and sedated  Airway & Oxygen Therapy: Patient Spontanous Breathing and Patient connected to nasal cannula oxygen  Post-op Assessment: Report given to PACU RN, Post -op Vital signs reviewed and stable and Patient moving all extremities  Post vital signs: Reviewed and stable  Complications: No apparent anesthesia complications

## 2013-09-06 NOTE — Anesthesia Preprocedure Evaluation (Addendum)
Anesthesia Evaluation  Patient identified by MRN, date of birth, ID band Patient awake    Reviewed: Allergy & Precautions, H&P , NPO status , Patient's Chart, lab work & pertinent test results  Airway Mallampati: II TM Distance: >3 FB Neck ROM: Full    Dental no notable dental hx. (+) Teeth Intact and Dental Advisory Given   Pulmonary asthma ,  breath sounds clear to auscultation  Pulmonary exam normal       Cardiovascular hypertension, On Medications Rhythm:Regular Rate:Normal + Systolic murmurs    Neuro/Psych negative neurological ROS  negative psych ROS   GI/Hepatic negative GI ROS, Neg liver ROS,   Endo/Other  Hypothyroidism   Renal/GU negative Renal ROS  negative genitourinary   Musculoskeletal   Abdominal   Peds  Hematology negative hematology ROS (+)   Anesthesia Other Findings   Reproductive/Obstetrics negative OB ROS                          Anesthesia Physical Anesthesia Plan  ASA: II  Anesthesia Plan: General   Post-op Pain Management:    Induction: Intravenous  Airway Management Planned: Oral ETT  Additional Equipment:   Intra-op Plan:   Post-operative Plan: Extubation in OR  Informed Consent: I have reviewed the patients History and Physical, chart, labs and discussed the procedure including the risks, benefits and alternatives for the proposed anesthesia with the patient or authorized representative who has indicated his/her understanding and acceptance.   Dental advisory given  Plan Discussed with: CRNA  Anesthesia Plan Comments:         Anesthesia Quick Evaluation

## 2013-09-06 NOTE — Preoperative (Signed)
Beta Blockers   Reason not to administer Beta Blockers:Not Applicable 

## 2013-09-06 NOTE — Progress Notes (Signed)
UR COMPLETED  

## 2013-09-07 ENCOUNTER — Encounter (HOSPITAL_COMMUNITY): Payer: Self-pay | Admitting: Orthopedic Surgery

## 2013-09-07 LAB — CBC
Platelets: 263 10*3/uL (ref 150–400)
RBC: 3.81 MIL/uL — ABNORMAL LOW (ref 3.87–5.11)
RDW: 13.7 % (ref 11.5–15.5)
WBC: 9.4 10*3/uL (ref 4.0–10.5)

## 2013-09-07 LAB — BASIC METABOLIC PANEL
CO2: 28 mEq/L (ref 19–32)
Chloride: 98 mEq/L (ref 96–112)
GFR calc Af Amer: >90 mL/min (ref >90)
Potassium: 3.5 mEq/L (ref 3.5–5.1)
Sodium: 135 mEq/L (ref 135–145)

## 2013-09-07 NOTE — Clinical Social Work Psychosocial (Signed)
Clinical Social Work Department  BRIEF PSYCHOSOCIAL ASSESSMENT  Patient: DELPHIA KAYLOR  Account Number: [id number] Admit date: 09/06/2013 Clinical Social Worker Sabino Niemann, MSW Date/Time: 09/07/2013 11:00 AM Referred by: Physician Date Referred: 09/06/13  Referred for   SNF Placement   Other Referral:  Interview type: Patient  Other interview type: PSYCHOSOCIAL DATA  Living Status: Spouse Admitted from facility:  Level of care:  Primary support name: Stankowski,Harold D Primary support relationship to patient: Spouse Degree of support available:  Strong and vested  CURRENT CONCERNS  Current Concerns   Post-Acute Placement   Other Concerns:  SOCIAL WORK ASSESSMENT / PLAN  CSW met with pt re: PT recommendation for SNF.   Pt lives with her husband  CSW explained placement process and answered questions.   Pt reports she is pre-registered at [facility]    CSW completed FL2 and initiated SNF search.     Assessment/plan status: Information/Referral to [facility]  Other assessment/ plan:  Information/referral to community resources:  SNF     PATIENT'S/FAMILY'S RESPONSE TO PLAN OF CARE:  Pt  reports she is agreeable to ST SNF in order to increase strength and independence with mobility prior to returning home  Pt verbalized understanding of placement process and appreciation for CSW assist.   Juliette Mangle, MSW [phone]

## 2013-09-07 NOTE — Progress Notes (Addendum)
Patient ID: Cheyenne Jones, female   DOB: January 12, 1941, 72 y.o.   MRN: [id number] PATIENT ID: Cheyenne Jones  MRN: [id number]  DOB/AGE:  May 14, 1941 / 72 y.o.  1 Day Post-Op Procedure(s) (LRB): TOTAL HIP ARTHROPLASTY (Right)    PROGRESS NOTE Subjective: Patient is alert, oriented,no Nausea, no Vomiting, yes passing gas, no Bowel Movement. Taking PO well. Denies SOB, Chest or Calf Pain. Using Incentive Spirometer, PAS in place. Ambulate able to stand with assistance with physical therapy yesterday Patient reports pain as 3 on 0-10 scale  .    Objective: Vital signs in last 24 hours: Filed Vitals:   09/06/13 1200 09/06/13 2052 09/07/13 0157 09/07/13 0606  BP: 160/50 136/55 138/65 163/63  Pulse: 60 83 98 104  Temp: 97.4 F (36.3 C) 98.9 F (37.2 C) 99.5 F (37.5 C) 98.7 F (37.1 C)  TempSrc:  Oral Oral Oral  Resp: 18 18 18 18   Height:    5\' 3"  (1.6 m)  Weight:    68.04 kg (150 lb)  SpO2: 99% 99% 98% 98%      Intake/Output from previous day: I/O last 3 completed shifts: In: 2780 [P.O.:480; I.V.:2300] Out: 5450 [Urine:5150; Blood:300]   Intake/Output this shift:     LABORATORY DATA:  Recent Labs  09/07/13 0610  WBC 9.4  HGB 12.1  HCT 35.4*  PLT 263  NA 135  K 3.5  CL 98  CO2 28  BUN 6  CREATININE 0.49*  GLUCOSE 134*  CALCIUM 9.3    Examination: Neurologically intact ABD soft Neurovascular intact Sensation intact distally Intact pulses distally Dorsiflexion/Plantar flexion intact Incision: scant drainage No cellulitis present Compartment soft} XR AP&Lat of hip shows well placed\fixed THA  Assessment:   1 Day Post-Op Procedure(s) (LRB): TOTAL HIP ARTHROPLASTY (Right) ADDITIONAL DIAGNOSIS:  Hypothyroidism  Plan: PT/OT WBAT, THA  posterior precautions  DVT Prophylaxis: SCDx72 hrs, ASA 325 mg BID x 2 weeks  DISCHARGE PLAN: Skilled Nursing Facility/Rehab, plan is for [facility], order forFL2 to be placed on chart  DISCHARGE NEEDS: HHPT, HHRN, CPM,  Walker and 3-in-1 comode seat

## 2013-09-07 NOTE — Progress Notes (Signed)
Physical Therapy Treatment Patient Details Name: Cheyenne Jones MRN: [id number] DOB: 18-Aug-1941 Today's Date: 09/07/2013 Time: 4098-1191 PT Time Calculation (min): 27 min  PT Assessment / Plan / Recommendation  History of Present Illness Patient is a 72 yo female s/p Rt. THA.   PT Comments   Patient progressing well with ambulation and therex. Patient able to recall all precautions. Daughter present throughout session. Continue to recommend SNF for ongoing Physical Therapy.     Follow Up Recommendations  SNF     Does the patient have the potential to tolerate intense rehabilitation     Barriers to Discharge        Equipment Recommendations  3in1 (PT)    Recommendations for Other Services    Frequency 7X/week   Progress towards PT Goals Progress towards PT goals: Progressing toward goals  Plan Current plan remains appropriate    Precautions / Restrictions Precautions Precautions: Posterior Hip Precaution Comments: Patient able to recall all precautions Restrictions Weight Bearing Restrictions: Yes RLE Weight Bearing: Weight bearing as tolerated   Pertinent Vitals/Pain no apparent distress    Mobility  Bed Mobility Bed Mobility: Sit to Supine Sit to Supine: 4: Min assist Details for Bed Mobility Assistance: Verbal cues for technique to maintain hip precautions.  Assist to move RLE  Transfers Sit to Stand: With upper extremity assist;From chair/3-in-1;From toilet;4: Min assist Stand to Sit: To toilet;To chair/3-in-1;With upper extremity assist;4: Min assist Details for Transfer Assistance: Cues for hand placement. A to ensure balance and control sit Ambulation/Gait Ambulation/Gait Assistance: 4: Min assist Ambulation Distance (Feet): 50 Feet Assistive device: Rolling walker Ambulation/Gait Assistance Details: A for RW management Gait Pattern: Step-to pattern Gait velocity: decreased    Exercises Total Joint Exercises Quad Sets: AROM;Right;10 reps Gluteal Sets:  AROM;10 reps;Both Heel Slides: AAROM;Right;10 reps Hip ABduction/ADduction: AAROM;Right;10 reps Long Arc Quad: AROM;Right;10 reps   PT Diagnosis:    PT Problem List:   PT Treatment Interventions:     PT Goals (current goals can now be found in the care plan section)    Visit Information  Last PT Received On: 09/07/13 Assistance Needed: +1 History of Present Illness: Patient is a 72 yo female s/p Rt. THA.    Subjective Data      Cognition  Cognition Arousal/Alertness: Awake/alert Behavior During Therapy: WFL for tasks assessed/performed Overall Cognitive Status: Within Functional Limits for tasks assessed    Balance     End of Session PT - End of Session Activity Tolerance: Patient tolerated treatment well Patient left: in bed;with call bell/phone within reach;with family/visitor present Nurse Communication: Mobility status   GP     Fredrich Birks 09/07/2013, 10:07 AM 09/07/2013 Fredrich Birks PTA [phone] pager [phone] office

## 2013-09-07 NOTE — Evaluation (Signed)
Occupational Therapy Evaluation Patient Details Name: Cheyenne Jones MRN: [id number] DOB: 1941/07/25 Today's Date: 09/07/2013 Time: 4098-1191 OT Time Calculation (min): 31 min  OT Assessment / Plan / Recommendation History of present illness Patient is a 72 yo female s/p Rt. THA.   Clinical Impression   Pt demos decline in function with ADLs and ADL mobility safety and would benefit from acute OT services to address impairments to increase level of function and safety    OT Assessment  Patient needs continued OT Services    Follow Up Recommendations  SNF;Supervision/Assistance - 24 hour    Barriers to Discharge Decreased caregiver support Pt plans to d/c to SNF for short term rehab  Equipment Recommendations  None recommended by OT;Other (comment) (TBD at SNF level of care)    Recommendations for Other Services    Frequency  Min 2X/week    Precautions / Restrictions Precautions Precautions: Posterior Hip Precaution Comments: Patient able to recall all precautions Restrictions Weight Bearing Restrictions: Yes RLE Weight Bearing: Weight bearing as tolerated   Pertinent Vitals/Pain 3/10    ADL  Grooming: Performed;Wash/dry hands;Wash/dry face;Brushing hair;Min guard Where Assessed - Grooming: Supported standing Upper Body Bathing: Simulated;Supervision/safety;Set up Lower Body Bathing: Maximal assistance Upper Body Dressing: Performed;Supervision/safety;Set up Lower Body Dressing: Performed;Maximal assistance Toilet Transfer: Performed;Minimal assistance Toilet Transfer Equipment: Regular height toilet;Grab bars Toileting - Clothing Manipulation and Hygiene: Performed;Moderate assistance Where Assessed - Toileting Clothing Manipulation and Hygiene: Standing Tub/Shower Transfer Method: Not assessed Equipment Used: Long-handled shoe horn;Long-handled sponge;Reacher;Gait belt;Rolling walker;Sock aid Transfers/Ambulation Related to ADLs: cues for safety, hand placement ADL  Comments: Pt and family provided with education and demo of ADL A/E    OT Diagnosis: Generalized weakness;Acute pain  OT Problem List: Decreased strength;Decreased knowledge of use of DME or AE;Decreased activity tolerance;Impaired balance (sitting and/or standing);Pain OT Treatment Interventions: Self-care/ADL training;Therapeutic exercise;Patient/family education;Neuromuscular education;Balance training;Therapeutic activities;DME and/or AE instruction   OT Goals(Current goals can be found in the care plan section) Acute Rehab OT Goals Patient Stated Goal: To get stronger OT Goal Formulation: With patient/family Time For Goal Achievement: 09/14/13 Potential to Achieve Goals: Good ADL Goals Pt Will Perform Grooming: with supervision;with set-up;standing Pt Will Perform Lower Body Bathing: with mod assist;with min assist;with adaptive equipment;with caregiver independent in assisting Pt Will Perform Lower Body Dressing: with min assist;with mod assist;with caregiver independent in assisting;with adaptive equipment Pt Will Transfer to Toilet: with min guard assist;grab bars;regular height toilet (3 in 1 over toilet) Pt Will Perform Toileting - Clothing Manipulation and hygiene: with min assist;with caregiver independent in assisting  Visit Information  Last OT Received On: 09/07/13 Assistance Needed: +1 History of Present Illness: Patient is a 72 yo female s/p Rt. THA.       Prior Functioning     Home Living Family/patient expects to be discharged to:: Skilled nursing facility Living Arrangements: Spouse/significant other Home Equipment: Walker - 2 wheels Prior Function Level of Independence: Independent Communication Communication: No difficulties Dominant Hand: Right         Vision/Perception Vision - History Baseline Vision: Wears contacts Patient Visual Report: No change from baseline Perception Perception: Within Functional Limits   Cognition   Cognition Arousal/Alertness: Awake/alert Behavior During Therapy: WFL for tasks assessed/performed Overall Cognitive Status: Within Functional Limits for tasks assessed    Extremity/Trunk Assessment Upper Extremity Assessment Upper Extremity Assessment: Overall WFL for tasks assessed;Generalized weakness     Mobility Bed Mobility Bed Mobility: Sit to Supine Supine to Sit: 3: Mod assist;HOB flat Sitting -  Scoot to [place] of Bed: 4: Min assist;With rail Sit to Supine: 4: Min assist Details for Bed Mobility Assistance: Verbal cues for technique to maintain hip precautions.  Assist to move RLE  Transfers Transfers: Sit to Stand;Stand to Sit Sit to Stand: With upper extremity assist;From chair/3-in-1;From toilet;4: Min assist Stand to Sit: To toilet;To chair/3-in-1;With upper extremity assist;4: Min assist;5: Supervision Details for Transfer Assistance: Cues for hand placement        Balance Balance Balance Assessed: Yes Dynamic Standing Balance Dynamic Standing - Balance Support: Bilateral upper extremity supported;During functional activity Dynamic Standing - Level of Assistance: 4: Min assist;5: Stand by assistance   End of Session OT - End of Session Equipment Utilized During Treatment: Gait belt;Rolling walker;Other (comment) (ADL A/E) Activity Tolerance: Patient tolerated treatment well Patient left: in bed;with call bell/phone within reach;with family/visitor present  GO     Galen Manila 09/07/2013, 12:00 PM

## 2013-09-08 LAB — CBC
HCT: 30.6 % — ABNORMAL LOW (ref 36.0–46.0)
Hemoglobin: 10.3 g/dL — ABNORMAL LOW (ref 12.0–15.0)
MCV: 92.7 fL (ref 78.0–100.0)
RBC: 3.3 MIL/uL — ABNORMAL LOW (ref 3.87–5.11)
RDW: 13.8 % (ref 11.5–15.5)
WBC: 8 10*3/uL (ref 4.0–10.5)

## 2013-09-08 MED ORDER — ASPIRIN EC 325 MG PO TBEC: 325.0000 mg | DELAYED_RELEASE_TABLET | Freq: Two times a day (BID) | ORAL | Status: DC

## 2013-09-08 NOTE — Progress Notes (Signed)
PATIENT ID: Cheyenne Jones  MRN: [id number]  DOB/AGE:  1941-09-06 / 72 y.o.  2 Days Post-Op Procedure(s) (LRB): TOTAL HIP ARTHROPLASTY (Right)    PROGRESS NOTE Subjective: Patient is alert, oriented,no Nausea, no Vomiting, yes passing gas, no Bowel Movement. Taking PO well. Denies SOB, Chest or Calf Pain. Using Incentive Spirometer, PAS in place. Ambulate Wbat, pt walking down hall with assistance. Patient reports pain as moderate  .    Objective: Vital signs in last 24 hours: Filed Vitals:   09/07/13 2000 09/07/13 2137 09/08/13 0000 09/08/13 0618  BP:  134/57  138/50  Pulse:  96  84  Temp:  99.4 F (37.4 C)  98.7 F (37.1 C)  TempSrc:      Resp: 17 18 18 18   Height:      Weight:      SpO2: 98% 98% 98% 98%      Intake/Output from previous day: I/O last 3 completed shifts: In: 2705 [P.O.:1080; I.V.:1625] Out: 4750 [Urine:4750]   Intake/Output this shift:     LABORATORY DATA:  Recent Labs  09/07/13 0610 09/08/13 0510  WBC 9.4 8.0  HGB 12.1 10.3*  HCT 35.4* 30.6*  PLT 263 216  NA 135  --   K 3.5  --   CL 98  --   CO2 28  --   BUN 6  --   CREATININE 0.49*  --   GLUCOSE 134*  --   CALCIUM 9.3  --     Examination: Neurologically intact Neurovascular intact Sensation intact distally Intact pulses distally Dorsiflexion/Plantar flexion intact Incision: scant drainage No cellulitis present} XR AP&Lat of hip shows well placed\fixed THA  Assessment:   2 Days Post-Op Procedure(s) (LRB): TOTAL HIP ARTHROPLASTY (Right) ADDITIONAL DIAGNOSIS:  Hypothyroidism  Plan: PT/OT WBAT, THA  posterior precautions  DVT Prophylaxis: SCDx72 hrs, ASA 325 mg BID x 2 weeks  DISCHARGE PLAN: Skilled Nursing Facility/Rehab to [facility] when passes PT.  DISCHARGE NEEDS: HHPT, HHRN, Walker and 3-in-1 comode seat

## 2013-09-08 NOTE — Progress Notes (Signed)
Physical Therapy Treatment Patient Details Name: Cheyenne Jones MRN: [id number] DOB: April 28, 1941 Today's Date: 09/08/2013 Time: 4098-1191 PT Time Calculation (min): 24 min  PT Assessment / Plan / Recommendation  History of Present Illness Patient is a 72 yo female s/p Rt. THA.   PT Comments   Patient able to progress with ambulation and working on step through gait pattern. Patient motivated and wanting to progress as much as possible. Still guarded with some mobility. Continue to recommend SNF for ongoing Physical Therapy.     Follow Up Recommendations  SNF     Does the patient have the potential to tolerate intense rehabilitation     Barriers to Discharge        Equipment Recommendations  3in1 (PT)    Recommendations for Other Services    Frequency 7X/week   Progress towards PT Goals Progress towards PT goals: Progressing toward goals  Plan Current plan remains appropriate    Precautions / Restrictions Precautions Precautions: Posterior Hip Precaution Comments: Patient able to recall all precautions Restrictions RLE Weight Bearing: Weight bearing as tolerated   Pertinent Vitals/Pain 3/10 R hip pain. RN made aware    Mobility  Bed Mobility Bed Mobility: Not assessed Details for Bed Mobility Assistance: Patient up in recliner Transfers Sit to Stand: With upper extremity assist;From chair/3-in-1;4: Min guard Stand to Sit: To chair/3-in-1;With upper extremity assist;4: Min guard;5: Supervision Details for Transfer Assistance: Cues for hand placement Ambulation/Gait Ambulation/Gait Assistance: 4: Min guard Ambulation Distance (Feet): 200 Feet Assistive device: Rolling walker Ambulation/Gait Assistance Details: CUes to attempt step through gait pattern. Progressing with ambulation Gait Pattern: Step-through pattern;Decreased stride length Gait velocity: decreased    Exercises Total Joint Exercises Quad Sets: AROM;Right;15 reps Gluteal Sets: AROM;Both;15 reps Heel  Slides: AAROM;Right;15 reps Hip ABduction/ADduction: AAROM;Right;15 reps Long Arc Quad: AROM;Right;15 reps   PT Diagnosis:    PT Problem List:   PT Treatment Interventions:     PT Goals (current goals can now be found in the care plan section)    Visit Information  Last PT Received On: 09/08/13 Assistance Needed: +1 History of Present Illness: Patient is a 72 yo female s/p Rt. THA.    Subjective Data      Cognition  Cognition Arousal/Alertness: Awake/alert Behavior During Therapy: WFL for tasks assessed/performed Overall Cognitive Status: Within Functional Limits for tasks assessed    Balance     End of Session PT - End of Session Activity Tolerance: Patient tolerated treatment well Patient left: in chair;with call bell/phone within reach;with family/visitor present   GP     Fredrich Birks 09/08/2013, 8:47 AM 09/08/2013 Fredrich Birks PTA [phone] pager [phone] office

## 2013-09-08 NOTE — Discharge Summary (Signed)
Patient ID: Cheyenne Jones MRN: [id number] DOB/AGE: December 11, 1941 72 y.o.  Admit date: 09/06/2013 Discharge date: 09/08/2013  Admission Diagnoses:  Principal Problem:   Hip arthritis Right   Discharge Diagnoses:  Same  Past Medical History  Diagnosis Date  . Hypertension   . Bronchitis, asthmatic   . Hypothyroidism   . Arthritis     Surgeries: Procedure(s): TOTAL HIP ARTHROPLASTY on 09/06/2013   Consultants:    Discharged Condition: Improved  Hospital Course: Cheyenne Jones is an 72 y.o. female who was admitted 09/06/2013 for operative treatment ofHip arthritis. Patient has severe unremitting pain that affects sleep, daily activities, and work/hobbies. After pre-op clearance the patient was taken to the operating room on 09/06/2013 and underwent  Procedure(s): TOTAL HIP ARTHROPLASTY.    Patient was given perioperative antibiotics: Anti-infectives   Start     Dose/Rate Route Frequency Ordered Stop   09/06/13 0600  ceFAZolin (ANCEF) IVPB 2 g/50 mL premix     2 g 100 mL/hr over 30 Minutes Intravenous On call to O.R. 09/05/13 1355 09/06/13 0735       Patient was given sequential compression devices, early ambulation, and chemoprophylaxis to prevent DVT.  Patient benefited maximally from hospital stay and there were no complications.    Recent vital signs: Patient Vitals for the past 24 hrs:  BP Temp Pulse Resp SpO2  09/08/13 0618 138/50 mmHg 98.7 F (37.1 C) 84 18 98 %  09/08/13 0000 - - - 18 98 %  09/07/13 2137 134/57 mmHg 99.4 F (37.4 C) 96 18 98 %  09/07/13 2000 - - - 17 98 %  09/07/13 1400 125/57 mmHg 99 F (37.2 C) 98 16 99 %  09/07/13 0800 - - - 16 98 %     Recent laboratory studies:  Recent Labs  09/07/13 0610 09/08/13 0510  WBC 9.4 8.0  HGB 12.1 10.3*  HCT 35.4* 30.6*  PLT 263 216  NA 135  --   K 3.5  --   CL 98  --   CO2 28  --   BUN 6  --   CREATININE 0.49*  --   GLUCOSE 134*  --   CALCIUM 9.3  --      Discharge Medications:     Medication  List         aspirin EC 325 MG tablet  Take 1 tablet (325 mg total) by mouth 2 (two) times daily.     Cholecalciferol 2000 UNITS Tabs  Take 1 tablet by mouth daily.     etodolac 400 MG tablet  Commonly known as:  LODINE  Take 400 mg by mouth 2 (two) times daily.     hydrochlorothiazide 25 MG tablet  Commonly known as:  HYDRODIURIL  Take 25 mg by mouth daily.     levothyroxine 88 MCG tablet  Commonly known as:  SYNTHROID, LEVOTHROID  Take 88 mcg by mouth daily before breakfast.     oxyCODONE-acetaminophen 5-325 MG per tablet  Commonly known as:  ROXICET  Take 1 tablet by mouth every 4 (four) hours as needed for pain.     tiZANidine 2 MG tablet  Commonly known as:  ZANAFLEX  Take 1 tablet (2 mg total) by mouth every 6 (six) hours as needed.        Diagnostic Studies: Dg Chest 2 View  08/31/2013   *RADIOLOGY REPORT*  Clinical Data: Preoperative examination (Right total hip replacement)  CHEST - 2 VIEW  Comparison: None.  Findings:  Normal cardiac  silhouette.  Minimal atherosclerotic plaque within the aortic arch.  No focal airspace opacity.  No pleural effusion or pneumothorax.  No evidence of edema.  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  IMPRESSION: No acute cardiopulmonary disease.   Original Report Authenticated By: Tacey Ruiz, MD   Dg Pelvis Portable  09/06/2013   *RADIOLOGY REPORT*  Clinical Data: Postop.  Status post right hip surgery.  PORTABLE PELVIS  Comparison: None.  Findings: Patient has undergone right total hip arthroplasty. Postoperative gas is identified within the joint space and soft tissues surrounding the hip.  Alignment appears normal on these anterior views.  No evidence for new fracture.  Visualized bowel gas pattern is nonobstructive.  Degenerative changes are seen in the lower spine.  IMPRESSION: Status post right hip arthroplasty.  No evidence for adverse features.   Original Report Authenticated By: Norva Pavlov, M.D.    Disposition:  Final discharge disposition not confirmed      Discharge Orders   Future Orders Complete By Expires   Call MD / Call 911  As directed    Comments:     If you experience chest pain or shortness of breath, CALL 911 and be transported to the hospital emergency room.  If you develope a fever above 101 F, pus (white drainage) or increased drainage or redness at the wound, or calf pain, call your surgeon's office.   Change dressing  As directed    Comments:     You may change your dressing on day 5, then change the dressing daily with sterile 4 x 4 inch gauze dressing and paper tape.  You may clean the incision with alcohol prior to redressing   Constipation Prevention  As directed    Comments:     Drink plenty of fluids.  Prune juice may be helpful.  You may use a stool softener, such as Colace (over the counter) 100 mg twice a day.  Use MiraLax (over the counter) for constipation as needed.   Diet - low sodium heart healthy  As directed    Discharge instructions  As directed    Comments:     Follow up 2 weeks with Dr. Turner Daniels in office.   Driving restrictions  As directed    Comments:     No driving for 2 weeks   Follow the hip precautions as taught in Physical Therapy  As directed    Increase activity slowly as tolerated  As directed    Patient may shower  As directed    Comments:     You may shower without a dressing once there is no drainage.  Do not wash over the wound.  If drainage remains, cover wound with plastic wrap and then shower.      Follow-up Information   Follow up with Nestor Lewandowsky, MD In 2 weeks.   Specialty:  Orthopedic Surgery   Contact information:   [address] [place] [address] [phone]        Signed: Henry Russel 09/08/2013, 7:47 AM

## 2013-09-09 ENCOUNTER — Other Ambulatory Visit: Payer: Self-pay | Admitting: *Deleted

## 2013-09-09 MED ORDER — OXYCODONE-ACETAMINOPHEN 5-325 MG PO TABS: 1.0000 | ORAL_TABLET | ORAL | Status: DC | PRN

## 2013-09-13 ENCOUNTER — Non-Acute Institutional Stay (SKILLED_NURSING_FACILITY): Payer: Medicare Other | Admitting: Internal Medicine

## 2013-09-13 ENCOUNTER — Encounter: Payer: Self-pay | Admitting: Internal Medicine

## 2013-09-13 DIAGNOSIS — M1610 Unilateral primary osteoarthritis, unspecified hip: Secondary | ICD-10-CM

## 2013-09-13 DIAGNOSIS — I10 Essential (primary) hypertension: Secondary | ICD-10-CM

## 2013-09-13 DIAGNOSIS — E039 Hypothyroidism, unspecified: Secondary | ICD-10-CM

## 2013-09-13 DIAGNOSIS — E559 Vitamin D deficiency, unspecified: Secondary | ICD-10-CM

## 2013-09-13 NOTE — Progress Notes (Signed)
Patient ID: Cheyenne Jones, female   DOB: 1941/07/08, 72 y.o.   MRN: [id number]   Correction- patient is at [facility] re[facility]

## 2013-09-13 NOTE — Progress Notes (Signed)
Patient ID: Cheyenne Jones, female   DOB: Feb 09, 1941, 72 y.o.   MRN: [id number]  Cheyenne Jones living [place]  PCP: Cheyenne Lance, MD  Code Status: full code  No Known Allergies  Chief Complaint: new admit  HPI:  72 y/o female patient is here for STR post hospital admission with hip arthritis from 09/06/13- 09/08/13. She underwent total hip arthroplasty and is here for STR. She was seen in her room today she has been working with therapy. Her pain is under control. She has occassional muscle cramps. Appetite is good. Has regular bowel movement, last one yesterday. No other complaints from patient. No new concern from staff.  Review of Systems  Constitutional: Negative for fever, chills, weight loss and diaphoresis.  HENT: Negative for congestion and sore throat.   Eyes: Negative for blurred vision.  Respiratory: Negative for cough, shortness of breath and wheezing.   Cardiovascular: Negative for chest pain, palpitations and leg swelling.  Gastrointestinal: Negative for heartburn, nausea, vomiting, abdominal pain and constipation.  Genitourinary: Negative for dysuria.  Musculoskeletal: Positive for joint pain. Negative for myalgias and falls.  Skin: Negative for rash.  Neurological: Negative for dizziness, seizures, loss of consciousness, weakness and headaches.  Psychiatric/Behavioral: Negative for depression and memory loss. The patient does not have insomnia.      Past Medical History  Diagnosis Date  . Hypertension   . Bronchitis, asthmatic   . Hypothyroidism   . Arthritis    Past Surgical History  Procedure Laterality Date  . Tonsillectomy    . Breast surgery Right     x 2  . Finger surgery Right     index finger  . Colonoscopy w/ polypectomy    . Total hip arthroplasty Right 09/06/2013    Procedure: TOTAL HIP ARTHROPLASTY;  Surgeon: Nestor Lewandowsky, MD;  Location: [facility];  Service: Orthopedics;  Laterality: Right;   Social History:   reports that she has never smoked. She does not  have any smokeless tobacco history on file. She reports that she drinks about 1.2 ounces of alcohol per week. She reports that she does not use illicit drugs.  History reviewed. No pertinent family history.  Medications: Patient's Medications  New Prescriptions   No medications on file  Previous Medications   ASPIRIN EC 325 MG TABLET    Take 1 tablet (325 mg total) by mouth 2 (two) times daily.   CHOLECALCIFEROL 2000 UNITS TABS    Take 1 tablet by mouth daily.   ETODOLAC (LODINE) 400 MG TABLET    Take 400 mg by mouth 2 (two) times daily.   HYDROCHLOROTHIAZIDE (HYDRODIURIL) 25 MG TABLET    Take 25 mg by mouth daily.   LEVOTHYROXINE (SYNTHROID, LEVOTHROID) 88 MCG TABLET    Take 88 mcg by mouth daily before breakfast.   OXYCODONE-ACETAMINOPHEN (ROXICET) 5-325 MG PER TABLET    Take 1 tablet by mouth every 4 (four) hours as needed for pain.   TIZANIDINE (ZANAFLEX) 2 MG TABLET    Take 1 tablet (2 mg total) by mouth every 6 (six) hours as needed.  Modified Medications   No medications on file  Discontinued Medications   No medications on file     Physical Exam: Filed Vitals:   09/13/13 1348  BP: 141/67  Pulse: 79  Temp: 97 F (36.1 C)  Resp: 16  SpO2: 96%   General- elderly female patient, pleasant, in NAD HEENT- no pallor, no icterus, no LAD, MMM cvs- normal s1,s2, rrr respi- CTAB abdo- bs+,  soft, non tender Ext- able to moveall 4, ROM in right hip limited, sutrues in place and dressing clean, palpable dorsalis pedis Neuro- aaox 3, no focal deficit Psych- normal mood and affect   Labs reviewed: Basic Metabolic Panel:  Recent Labs  96/04/54 0935 09/07/13 0610  NA 138 135  K 3.6 3.5  CL 100 98  CO2 29 28  GLUCOSE 100* 134*  BUN 14 6  CREATININE 0.54 0.49*  CALCIUM 10.0 9.3   CBC:  Recent Labs  08/31/13 0935 09/07/13 0610 09/08/13 0510  WBC 6.6 9.4 8.0  NEUTROABS 4.9  --   --   HGB 14.3 12.1 10.3*  HCT 40.9 35.4* 30.6*  MCV 91.7 92.9 92.7  PLT 298 263 216     Radiological Exams: Dg Chest 2 View  08/31/2013   *RADIOLOGY REPORT*  Clinical Data: Preoperative examination (Right total hip replacement)  CHEST - 2 VIEW  Comparison: None.  Findings:  Normal cardiac silhouette.  Minimal atherosclerotic plaque within the aortic arch.  No focal airspace opacity.  No pleural effusion or pneumothorax.  No evidence of edema.  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  IMPRESSION: No acute cardiopulmonary disease.   Original Report Authenticated By: Tacey Ruiz, MD   Dg Pelvis Portable  09/06/2013   *RADIOLOGY REPORT*  Clinical Data: Postop.  Status post right hip surgery.  PORTABLE PELVIS  Comparison: None.  Findings: Patient has undergone right total hip arthroplasty. Postoperative gas is identified within the joint space and soft tissues surrounding the hip.  Alignment appears normal on these anterior views.  No evidence for new fracture.  Visualized bowel gas pattern is nonobstructive.  Degenerative changes are seen in the lower spine.  IMPRESSION: Status post right hip arthroplasty.  No evidence for adverse features.   Original Report Authenticated By: Norva Pavlov, M.D.    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 Pain under control. Continue oxycodone-apap 5-325 1 tab q4h prn. Also continue zanaflex for muscle spasm. Continue asa 325 mg bid for dvt prophylaxis for now. Also on etodolac for now. Sutures in place. Continue skin care. Continue working with PT and OT for gait training. Fall precautions. To follow up with orthopedics Dr Turner Daniels in 2 weeks and to take posterior hip precautions  hypertension- bp remains stable. Continue HCTZ 25 mg po daily and monitor bp. Check bmp  Hypothyroidism- continue levothyroxine for now. Monitor clinically  Vitamin d def- contnue vitamin d supplement.   Family/ staff Communication: reviewed care plan with patient , charge nurse    Labs/tests ordered- cbc, cmp

## 2013-09-13 NOTE — Clinical Social Work Placement (Signed)
Clinical Social Work Department  CLINICAL SOCIAL WORK PLACEMENT NOTE  09/07/2013 Patient: Cheyenne Jones Account Number: [id number] Admit date: 09/03/13 Clinical Social Worker: Sabino Niemann LCSWA Date/time: 09/13/2013  1:30 PM Clinical Social Work is seeking post-discharge placement for this patient at the following level of care: SKILLED NURSING (*CSW will update this form in Epic as items are completed)  9/9/2014Patient/family provided with [facility] Department of Clinical Social Work's list of facilities offering this level of care within the geographic area requested by the patient (or if unable, by the patient's family).  09/07/2013 Patient/family informed of their freedom to choose among providers that offer the needed level of care, that participate in Medicare, Medicaid or managed care program needed by the patient, have an available bed and are willing to accept the patient.  09/07/2013 Patient/family informed of [facility]' ownership interest in [facility], as well as of the fact that they are under no obligation to receive care at this facility.  PASARR submitted to EDS on 09/07/2013 PASARR number received from EDS on 09/07/2013 FL2 transmitted to all facilities in geographic area requested by pt/family on 09/07/2013 FL2 transmitted to all facilities within larger geographic area on  Patient informed that his/her managed care company has contracts with or will negotiate with certain facilities, including the following:  Patient/family informed of bed offers received:   Patient chooses bed at [facility] Physician recommends and patient chooses bed at  Patient to be transferred to on 09/08/13 Patient to be transferred to facility by [facility] The following physician request were entered in Epic:  Additional Comments:   Signing off

## 2013-09-23 ENCOUNTER — Non-Acute Institutional Stay: Payer: Medicare Other | Admitting: Nurse Practitioner

## 2013-09-23 ENCOUNTER — Non-Acute Institutional Stay (SKILLED_NURSING_FACILITY): Payer: Medicare Other | Admitting: Nurse Practitioner

## 2013-09-23 DIAGNOSIS — E559 Vitamin D deficiency, unspecified: Secondary | ICD-10-CM

## 2013-09-23 DIAGNOSIS — I10 Essential (primary) hypertension: Secondary | ICD-10-CM

## 2013-09-23 DIAGNOSIS — E039 Hypothyroidism, unspecified: Secondary | ICD-10-CM

## 2013-09-23 DIAGNOSIS — Z96649 Presence of unspecified artificial hip joint: Secondary | ICD-10-CM

## 2013-09-23 DIAGNOSIS — M1610 Unilateral primary osteoarthritis, unspecified hip: Secondary | ICD-10-CM

## 2014-01-11 NOTE — Progress Notes (Signed)
Patient ID: Cheyenne Jones, female   DOB: Jun 15, 1941, 73 y.o.   MRN: [id number]   Patient ID: Cheyenne Jones, female   DOB: [birth year], 73 y.o.   MRN: [id number]  Cheyenne Jones living [place]  PCP:  Code Status: full code  No Known Allergies  Chief Complaint  Patient presents with  . Discharge Note    HPI:  73 y/o female patient is here for STR post hospital admission with hip arthritis from 09/06/13- 09/08/13. She underwent total hip arthroplasty and is here for STR. She was seen in her room today she has been working with therapy. Her pain is under control. She has occassional muscle cramps. Appetite is good. Has regular bowel movement, last one yesterday. No other complaints from patient. No new concern from staff.  Review of Systems  Constitutional: Negative for fever, chills, weight loss and diaphoresis.  HENT: Negative for congestion and sore throat.   Eyes: Negative for blurred vision.  Respiratory: Negative for cough, shortness of breath and wheezing.   Cardiovascular: Negative for chest pain, palpitations and leg swelling.  Gastrointestinal: Negative for heartburn, nausea, vomiting, abdominal pain and constipation.  Genitourinary: Negative for dysuria.  Musculoskeletal: Positive for joint pain. Negative for falls and myalgias.  Skin: Negative for rash.  Neurological: Negative for dizziness, seizures, loss of consciousness, weakness and headaches.  Psychiatric/Behavioral: Negative for depression and memory loss. The patient does not have insomnia.      Past Medical History  Diagnosis Date  . Hypertension   . Bronchitis, asthmatic   . Hypothyroidism   . Arthritis    Past Surgical History  Procedure Laterality Date  . Tonsillectomy    . Breast surgery Right     x 2  . Finger surgery Right     index finger  . Colonoscopy w/ polypectomy    . Total hip arthroplasty Right 09/06/2013    Procedure: TOTAL HIP ARTHROPLASTY;  Surgeon: Kerin Salen, MD;  Location: [place];  Service:  Orthopedics;  Laterality: Right;   Social History:   reports that she has never smoked. She does not have any smokeless tobacco history on file. She reports that she drinks about 1.2 ounces of alcohol per week. She reports that she does not use illicit drugs.  No family history on file.  Medications: Patient's Medications  New Prescriptions   No medications on file  Previous Medications   ASPIRIN EC 325 MG TABLET    Take 1 tablet (325 mg total) by mouth 2 (two) times daily.   CHOLECALCIFEROL 2000 UNITS TABS    Take 1 tablet by mouth daily.   ETODOLAC (LODINE) 400 MG TABLET    Take 400 mg by mouth 2 (two) times daily.   HYDROCHLOROTHIAZIDE (HYDRODIURIL) 25 MG TABLET    Take 25 mg by mouth daily.   LEVOTHYROXINE (SYNTHROID, LEVOTHROID) 88 MCG TABLET    Take 88 mcg by mouth daily before breakfast.   OXYCODONE-ACETAMINOPHEN (ROXICET) 5-325 MG PER TABLET    Take 1 tablet by mouth every 4 (four) hours as needed for pain.   TIZANIDINE (ZANAFLEX) 2 MG TABLET    Take 1 tablet (2 mg total) by mouth every 6 (six) hours as needed.  Modified Medications   No medications on file  Discontinued Medications   No medications on file     Physical Exam:  Blood pressure 136/68  Pulse 74 Respiratory rate 18 Pulse oximetry 97%  Female, alert, verbally appropriate, in no apparent distress. HEENT- pupils are equally round  and reactive to light, extraocular movements intact. Oropharynx is unremarkable. Apical pulse is with a regular rate and rhythm, there is no significant edema Bilateral breath sounds are clear Abdomen, positive bowel sounds, soft and nontender palpation. Ext- able to moveall 4, ROM in right hip limited, palpable pedal dorsalis pulses, incision line is well healed. Neuro- aaox 3, no focal deficit Psych- normal mood and affect  Labs reviewed:  09/09/2013 WBC 6.8 RBC 3.6 Hemoglobin 11.0 Hematocrit 34 MCV 94.7 MCH 30.6 MCHC 32.4 RDW 13.4 Platelets 297  Sodium  133 Potassium 3.5 Chloride 95 CO2 29 AGAP 8 Glucose 185 BUN 11 Creatinine 0.5 Calcium 9.2  Vitamin D 14.80  T4-10 0.40 T3, free 2.78 T3 Res Uptake 50  Basic Metabolic Panel:  Recent Labs  08/31/13 0935 09/07/13 0610  NA 138 135  K 3.6 3.5  CL 100 98  CO2 29 28  GLUCOSE 100* 134*  BUN 14 6  CREATININE 0.54 0.49*  CALCIUM 10.0 9.3   CBC:  Recent Labs  08/31/13 0935 09/07/13 0610 09/08/13 0510  WBC 6.6 9.4 8.0  NEUTROABS 4.9  --   --   HGB 14.3 12.1 10.3*  HCT 40.9 35.4* 30.6*  MCV 91.7 92.9 92.7  PLT 298 263 216    Radiological Exams: Dg Chest 2 View  08/31/2013   *RADIOLOGY REPORT*  Clinical Data: Preoperative examination (Right total hip replacement)  CHEST - 2 VIEW  Comparison: None.  Findings:  Normal cardiac silhouette.  Minimal atherosclerotic plaque within the aortic arch.  No focal airspace opacity.  No pleural effusion or pneumothorax.  No evidence of edema.  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  IMPRESSION: No acute cardiopulmonary disease.   Original Report Authenticated By: Jake Seats, MD   Dg Pelvis Portable  09/06/2013   *RADIOLOGY REPORT*  Clinical Data: Postop.  Status post right hip surgery.  PORTABLE PELVIS  Comparison: None.  Findings: Patient has undergone right total hip arthroplasty. Postoperative gas is identified within the joint space and soft tissues surrounding the hip.  Alignment appears normal on these anterior views.  No evidence for new fracture.  Visualized bowel gas pattern is nonobstructive.  Degenerative changes are seen in the lower spine.  IMPRESSION: Status post right hip arthroplasty.  No evidence for adverse features.   Original Report Authenticated By: Nolon Nations, M.D.    Assessment/Plan  Patient scheduled for discharge on 09/23/2013. She will be going home. She will be receiving home health services for PT/OT, purpose of strengthening, mobility, safety, and adaptation for ADLs.  Right hip OA- s/p  right total hip arthroplasty. Pain under control. Continue oxycodone-apap 5-325 1 tab q4h prn. Also continue zanaflex for muscle spasm. Continue asa 325 mg bid for dvt prophylaxis for now. Also on etodolac for now.  Continue skin care,fall precautions, and to posterior hip precautions.  Renal function is okay, will okay the use of NSAIDs at this time. Limited amount of narcotics written, further pain medications will be provided by her orthopedist.  Hypertension, stable. Continue HCTZ 25 mg po daily and monitor bp. BMP from 09/09/2013 within acceptable limits, will be followed by her PCP  Hypothyroidism- continue levothyroxine for now. Will be followed by her PCP  Vitamin d def- contnue vitamin d supplement.

## 2014-01-14 NOTE — Progress Notes (Signed)
This encounter was created in error - please disregard.

## 2014-01-14 NOTE — Progress Notes (Signed)
Patient ID: Cheyenne Jones, female   DOB: [birth year], 73 y.o.   MRN: [id number]   09/23/2013 [address]  No Known Allergies  Chief Complaint  Patient presents with  . Discharge Note    HPI:  73 y/o female patient is here for STR post hospital admission with hip arthritis from 09/06/13- 09/08/13. She underwent total hip arthroplasty and is here for STR. She was seen in her room today she has been working with therapy. Her pain is under control. She has occassional muscle cramps. Appetite is good. Has regular bowel movement, last one yesterday. No other complaints from patient. No new concern from staff.  Review of Systems  Constitutional: Negative for fever, chills, weight loss and diaphoresis.  HENT: Negative for congestion and sore throat.   Eyes: Negative for blurred vision.  Respiratory: Negative for cough, shortness of breath and wheezing.   Cardiovascular: Negative for chest pain, palpitations and leg swelling.  Gastrointestinal: Negative for heartburn, nausea, vomiting, abdominal pain and constipation.  Genitourinary: Negative for dysuria.  Musculoskeletal: Positive for joint pain. Negative for falls and myalgias.  Skin: Negative for rash.  Neurological: Negative for dizziness, seizures, loss of consciousness, weakness and headaches.  Psychiatric/Behavioral: Negative for depression and memory loss. The patient does not have insomnia.      Past Medical History  Diagnosis Date  . Hypertension   . Bronchitis, asthmatic   . Hypothyroidism   . Arthritis    Past Surgical History  Procedure Laterality Date  . Tonsillectomy    . Breast surgery Right     x 2  . Finger surgery Right     index finger  . Colonoscopy w/ polypectomy    . Total hip arthroplasty Right 09/06/2013    Procedure: TOTAL HIP ARTHROPLASTY;  Surgeon: Kerin Salen, MD;  Location: [place];  Service: Orthopedics;  Laterality: Right;   Social History:   reports that she has never smoked. She does not  have any smokeless tobacco history on file. She reports that she drinks about 1.2 ounces of alcohol per week. She reports that she does not use illicit drugs.  No family history on file.  Medications: Patient's Medications  New Prescriptions   No medications on file  Previous Medications   ASPIRIN EC 325 MG TABLET    Take 1 tablet (325 mg total) by mouth 2 (two) times daily.   CHOLECALCIFEROL 2000 UNITS TABS    Take 1 tablet by mouth daily.   ETODOLAC (LODINE) 400 MG TABLET    Take 400 mg by mouth 2 (two) times daily.   HYDROCHLOROTHIAZIDE (HYDRODIURIL) 25 MG TABLET    Take 25 mg by mouth daily.   LEVOTHYROXINE (SYNTHROID, LEVOTHROID) 88 MCG TABLET    Take 88 mcg by mouth daily before breakfast.   OXYCODONE-ACETAMINOPHEN (ROXICET) 5-325 MG PER TABLET    Take 1 tablet by mouth every 4 (four) hours as needed for pain.   TIZANIDINE (ZANAFLEX) 2 MG TABLET    Take 1 tablet (2 mg total) by mouth every 6 (six) hours as needed.  Modified Medications   No medications on file  Discontinued Medications   No medications on file     Physical Exam:  Blood pressure 136/68  Pulse 74 Respiratory rate 18 Pulse oximetry 97%  Female, alert, verbally appropriate, in no apparent distress. HEENT- pupils are equally round and reactive to light, extraocular movements intact. Oropharynx is unremarkable. Apical pulse is with a regular rate and rhythm, there  is no significant edema Bilateral breath sounds are clear Abdomen, positive bowel sounds, soft and nontender palpation. Ext- able to moveall 4, ROM in right hip limited, palpable pedal dorsalis pulses, incision line is well healed. Neuro- aaox 3, no focal deficit Psych- normal mood and affect  Labs reviewed:  09/09/2013 WBC 6.8 RBC 3.6 Hemoglobin 11.0 Hematocrit 34 MCV 94.7 MCH 30.6 MCHC 32.4 RDW 13.4 Platelets 297  Sodium 133 Potassium 3.5 Chloride 95 CO2 29 AGAP 8 Glucose 185 BUN 11 Creatinine 0.5 Calcium 9.2  Vitamin D  14.80  T4-10 0.40 T3, free 2.78 T3 Res Uptake 50  Basic Metabolic Panel:  Recent Labs  08/31/13 0935 09/07/13 0610  NA 138 135  K 3.6 3.5  CL 100 98  CO2 29 28  GLUCOSE 100* 134*  BUN 14 6  CREATININE 0.54 0.49*  CALCIUM 10.0 9.3   CBC:  Recent Labs  08/31/13 0935 09/07/13 0610 09/08/13 0510  WBC 6.6 9.4 8.0  NEUTROABS 4.9  --   --   HGB 14.3 12.1 10.3*  HCT 40.9 35.4* 30.6*  MCV 91.7 92.9 92.7  PLT 298 263 216    Radiological Exams: Dg Chest 2 View  08/31/2013   *RADIOLOGY REPORT*  Clinical Data: Preoperative examination (Right total hip replacement)  CHEST - 2 VIEW  Comparison: None.  Findings:  Normal cardiac silhouette.  Minimal atherosclerotic plaque within the aortic arch.  No focal airspace opacity.  No pleural effusion or pneumothorax.  No evidence of edema.  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  IMPRESSION: No acute cardiopulmonary disease.   Original Report Authenticated By: Jake Seats, MD   Dg Pelvis Portable  09/06/2013   *RADIOLOGY REPORT*  Clinical Data: Postop.  Status post right hip surgery.  PORTABLE PELVIS  Comparison: None.  Findings: Patient has undergone right total hip arthroplasty. Postoperative gas is identified within the joint space and soft tissues surrounding the hip.  Alignment appears normal on these anterior views.  No evidence for new fracture.  Visualized bowel gas pattern is nonobstructive.  Degenerative changes are seen in the lower spine.  IMPRESSION: Status post right hip arthroplasty.  No evidence for adverse features.   Original Report Authenticated By: Nolon Nations, M.D.    Assessment/Plan  Patient scheduled for discharge on 09/23/2013. She will be going home. She will be receiving home health services for PT/OT, purpose of strengthening, mobility, safety, and adaptation for ADLs.  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 Pain under control. Continue oxycodone-apap 5-325 1 tab q4h prn. Also continue  zanaflex for muscle spasm. Continue asa 325 mg bid for dvt prophylaxis for now. Also on etodolac for now.  Continue skin care,fall precautions, and to posterior hip precautions.  Renal function is okay, will okay the use of NSAIDs at this time. Limited amount of narcotics written, further pain medications will be provided by her orthopedist.  Hypertension, stable. Continue HCTZ 25 mg po daily and monitor bp. BMP from 09/09/2013 within acceptable limits, will be followed by her PCP  Hypothyroidism- continue levothyroxine for now. Will be followed by her PCP  Vitamin d def- contnue vitamin d supplement.

## 2014-01-14 NOTE — Addendum Note (Signed)
Addended by: Boyce Medici on: 01/14/2014 12:33 PM   Modules accepted: Level of Service, SmartSet

## 2014-10-19 ENCOUNTER — Other Ambulatory Visit (HOSPITAL_COMMUNITY): Payer: Self-pay | Admitting: Family Medicine

## 2014-10-19 ENCOUNTER — Ambulatory Visit (HOSPITAL_COMMUNITY): Payer: Commercial Managed Care - HMO | Attending: Family Medicine

## 2014-10-19 DIAGNOSIS — I10 Essential (primary) hypertension: Secondary | ICD-10-CM | POA: Insufficient documentation

## 2014-10-19 DIAGNOSIS — R011 Cardiac murmur, unspecified: Secondary | ICD-10-CM | POA: Diagnosis not present

## 2014-10-19 DIAGNOSIS — E039 Hypothyroidism, unspecified: Secondary | ICD-10-CM | POA: Insufficient documentation

## 2014-10-19 NOTE — Progress Notes (Signed)
2D Echo completed. 10/19/2014

## 2015-02-07 DIAGNOSIS — H2513 Age-related nuclear cataract, bilateral: Secondary | ICD-10-CM | POA: Diagnosis not present

## 2015-02-07 DIAGNOSIS — H43813 Vitreous degeneration, bilateral: Secondary | ICD-10-CM | POA: Diagnosis not present

## 2015-09-15 DIAGNOSIS — Z1231 Encounter for screening mammogram for malignant neoplasm of breast: Secondary | ICD-10-CM | POA: Diagnosis not present

## 2015-11-02 DIAGNOSIS — I10 Essential (primary) hypertension: Secondary | ICD-10-CM | POA: Diagnosis not present

## 2015-11-02 DIAGNOSIS — J3489 Other specified disorders of nose and nasal sinuses: Secondary | ICD-10-CM | POA: Diagnosis not present

## 2015-12-01 DIAGNOSIS — E7800 Pure hypercholesterolemia, unspecified: Secondary | ICD-10-CM | POA: Diagnosis not present

## 2015-12-01 DIAGNOSIS — Z0000 Encounter for general adult medical examination without abnormal findings: Secondary | ICD-10-CM | POA: Diagnosis not present

## 2015-12-01 DIAGNOSIS — I700 Atherosclerosis of aorta: Secondary | ICD-10-CM | POA: Diagnosis not present

## 2015-12-01 DIAGNOSIS — J309 Allergic rhinitis, unspecified: Secondary | ICD-10-CM | POA: Diagnosis not present

## 2015-12-01 DIAGNOSIS — E890 Postprocedural hypothyroidism: Secondary | ICD-10-CM | POA: Diagnosis not present

## 2015-12-01 DIAGNOSIS — M8580 Other specified disorders of bone density and structure, unspecified site: Secondary | ICD-10-CM | POA: Diagnosis not present

## 2015-12-01 DIAGNOSIS — I10 Essential (primary) hypertension: Secondary | ICD-10-CM | POA: Diagnosis not present

## 2015-12-01 DIAGNOSIS — E559 Vitamin D deficiency, unspecified: Secondary | ICD-10-CM | POA: Diagnosis not present

## 2015-12-04 ENCOUNTER — Ambulatory Visit (HOSPITAL_COMMUNITY): Admission: RE | Admit: 2015-12-04 | Payer: Commercial Managed Care - HMO | Source: Ambulatory Visit

## 2015-12-04 ENCOUNTER — Ambulatory Visit (HOSPITAL_COMMUNITY)
Admission: RE | Admit: 2015-12-04 | Discharge: 2015-12-04 | Disposition: A | Discharge status: Home / Self Care | Payer: Commercial Managed Care - HMO | Source: Ambulatory Visit | Attending: Family Medicine | Admitting: Family Medicine

## 2015-12-04 ENCOUNTER — Other Ambulatory Visit (HOSPITAL_COMMUNITY): Payer: Self-pay | Admitting: Family Medicine

## 2015-12-04 DIAGNOSIS — I10 Essential (primary) hypertension: Secondary | ICD-10-CM | POA: Diagnosis not present

## 2015-12-04 DIAGNOSIS — I352 Nonrheumatic aortic (valve) stenosis with insufficiency: Secondary | ICD-10-CM | POA: Diagnosis not present

## 2015-12-04 DIAGNOSIS — I350 Nonrheumatic aortic (valve) stenosis: Secondary | ICD-10-CM | POA: Diagnosis not present

## 2015-12-04 DIAGNOSIS — I340 Nonrheumatic mitral (valve) insufficiency: Secondary | ICD-10-CM | POA: Diagnosis not present

## 2015-12-04 DIAGNOSIS — I359 Nonrheumatic aortic valve disorder, unspecified: Secondary | ICD-10-CM | POA: Diagnosis present

## 2015-12-04 DIAGNOSIS — I071 Rheumatic tricuspid insufficiency: Secondary | ICD-10-CM | POA: Diagnosis not present

## 2015-12-04 NOTE — Progress Notes (Signed)
  Echocardiogram 2D Echocardiogram has been performed.  Darlina Sicilian M 12/04/2015, 12:06 PM

## 2015-12-14 ENCOUNTER — Encounter: Payer: Self-pay | Admitting: Podiatry

## 2015-12-14 ENCOUNTER — Ambulatory Visit (INDEPENDENT_AMBULATORY_CARE_PROVIDER_SITE_OTHER): Payer: Commercial Managed Care - HMO | Admitting: Podiatry

## 2015-12-14 VITALS — BP 161/81 | HR 68 | Resp 16 | Ht 63.0 in | Wt 152.0 lb

## 2015-12-14 DIAGNOSIS — B079 Viral wart, unspecified: Secondary | ICD-10-CM | POA: Diagnosis not present

## 2015-12-14 DIAGNOSIS — B078 Other viral warts: Secondary | ICD-10-CM

## 2015-12-14 DIAGNOSIS — Q828 Other specified congenital malformations of skin: Secondary | ICD-10-CM | POA: Diagnosis not present

## 2015-12-14 NOTE — Progress Notes (Signed)
   Subjective:    Patient ID: Cheyenne Jones, female    DOB: July 15, 1941, 74 y.o.   MRN: [id number]  HPI Patient presents with a wart on their left foot, plantar forefoot; x1 months.   Review of Systems  Genitourinary: Positive for frequency.  Allergic/Immunologic: Positive for environmental allergies.  Hematological: Bruises/bleeds easily.  All other systems reviewed and are negative.      Objective:   Physical Exam        Assessment & Plan:

## 2015-12-15 NOTE — Progress Notes (Signed)
Subjective:     Patient ID: Cheyenne Jones, female   DOB: 1941-12-05, 74 y.o.   MRN: [id number]  HPI patient presents stating I have this painful lesion on the bottom my left foot for the last several months and I've tried wider shoes and I've tried different treatment options to treat it without relief   Review of Systems  All other systems reviewed and are negative.      Objective:   Physical Exam  Constitutional: She is oriented to person, place, and time.  Cardiovascular: Intact distal pulses.   Musculoskeletal: Normal range of motion.  Neurological: She is oriented to person, place, and time.  Skin: Skin is warm.  Nursing note and vitals reviewed.  neurovascular status intact muscle strength adequate range of motion within normal limits with patient noted to have plantar keratotic lesion left forefoot around the third metatarsal with a lucent core and pain when palpated. 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 Today deep debridement of lesion with padding was accomplished and patient will be seen back as needed and may require further treatment in future if symptoms were to reoccur

## 2016-01-16 DIAGNOSIS — M8589 Other specified disorders of bone density and structure, multiple sites: Secondary | ICD-10-CM | POA: Diagnosis not present

## 2016-01-16 DIAGNOSIS — M859 Disorder of bone density and structure, unspecified: Secondary | ICD-10-CM | POA: Diagnosis not present

## 2016-01-31 ENCOUNTER — Other Ambulatory Visit: Payer: Self-pay | Admitting: Family Medicine

## 2016-01-31 ENCOUNTER — Ambulatory Visit
Admission: RE | Admit: 2016-01-31 | Discharge: 2016-01-31 | Disposition: A | Discharge status: Home / Self Care | Payer: Commercial Managed Care - HMO | Source: Ambulatory Visit | Attending: Family Medicine | Admitting: Family Medicine

## 2016-01-31 DIAGNOSIS — R937 Abnormal findings on diagnostic imaging of other parts of musculoskeletal system: Secondary | ICD-10-CM

## 2016-01-31 DIAGNOSIS — Z0389 Encounter for observation for other suspected diseases and conditions ruled out: Secondary | ICD-10-CM | POA: Diagnosis not present

## 2016-02-09 DIAGNOSIS — H2513 Age-related nuclear cataract, bilateral: Secondary | ICD-10-CM | POA: Diagnosis not present

## 2016-03-26 DIAGNOSIS — J329 Chronic sinusitis, unspecified: Secondary | ICD-10-CM | POA: Diagnosis not present

## 2016-12-03 DIAGNOSIS — Z86010 Personal history of colonic polyps: Secondary | ICD-10-CM | POA: Diagnosis not present

## 2016-12-03 DIAGNOSIS — I350 Nonrheumatic aortic (valve) stenosis: Secondary | ICD-10-CM | POA: Diagnosis not present

## 2016-12-03 DIAGNOSIS — E559 Vitamin D deficiency, unspecified: Secondary | ICD-10-CM | POA: Diagnosis not present

## 2016-12-03 DIAGNOSIS — I700 Atherosclerosis of aorta: Secondary | ICD-10-CM | POA: Diagnosis not present

## 2016-12-03 DIAGNOSIS — E890 Postprocedural hypothyroidism: Secondary | ICD-10-CM | POA: Diagnosis not present

## 2016-12-03 DIAGNOSIS — I10 Essential (primary) hypertension: Secondary | ICD-10-CM | POA: Diagnosis not present

## 2016-12-03 DIAGNOSIS — E7800 Pure hypercholesterolemia, unspecified: Secondary | ICD-10-CM | POA: Diagnosis not present

## 2016-12-03 DIAGNOSIS — J309 Allergic rhinitis, unspecified: Secondary | ICD-10-CM | POA: Diagnosis not present

## 2016-12-03 DIAGNOSIS — Z0000 Encounter for general adult medical examination without abnormal findings: Secondary | ICD-10-CM | POA: Diagnosis not present

## 2016-12-05 ENCOUNTER — Other Ambulatory Visit: Payer: Self-pay | Admitting: Family Medicine

## 2016-12-05 DIAGNOSIS — I350 Nonrheumatic aortic (valve) stenosis: Secondary | ICD-10-CM

## 2016-12-09 ENCOUNTER — Encounter: Payer: Self-pay | Admitting: Podiatry

## 2016-12-09 ENCOUNTER — Ambulatory Visit (INDEPENDENT_AMBULATORY_CARE_PROVIDER_SITE_OTHER): Payer: Commercial Managed Care - HMO | Admitting: Podiatry

## 2016-12-09 ENCOUNTER — Ambulatory Visit (INDEPENDENT_AMBULATORY_CARE_PROVIDER_SITE_OTHER): Payer: Commercial Managed Care - HMO

## 2016-12-09 VITALS — Resp 16 | Ht 64.0 in | Wt 153.0 lb

## 2016-12-09 DIAGNOSIS — L84 Corns and callosities: Secondary | ICD-10-CM | POA: Diagnosis not present

## 2016-12-09 DIAGNOSIS — M79672 Pain in left foot: Secondary | ICD-10-CM | POA: Diagnosis not present

## 2016-12-09 DIAGNOSIS — M79674 Pain in right toe(s): Secondary | ICD-10-CM

## 2016-12-09 NOTE — Progress Notes (Signed)
Subjective:     Patient ID: Cheyenne Jones, female   DOB: 1941/07/11, 75 y.o.   MRN: [id number]  HPI patient presents stating the hammer fell on her right foot and she is worried she broke her toe and it's been sore and her left foot she's got a lesion that's very painful and makes it hard to walk comfortably   Review of Systems     Objective:   Physical Exam Neurovascular status intact muscle strength adequate range of motion within normal limits with patient noted to have mild edema in the third digit right foot localized and on the left plantar foot around the first metatarsal there is keratotic tissue that's painful when pressed    Assessment:     Traumatized right third digit #1 #2 plantarflexed metatarsal with inflammatory lesion left    Plan:     H&P and all conditions reviewed with patient and at this time I did recommend wider shoes and soaks for the right and for the left using sharp sterile his mentation I debrided the lesion. Reappoint to recheck  X-ray report indicated that the bone itself looks good with no indications of structural changes

## 2016-12-24 ENCOUNTER — Other Ambulatory Visit (HOSPITAL_COMMUNITY): Payer: Commercial Managed Care - HMO

## 2016-12-31 ENCOUNTER — Other Ambulatory Visit: Payer: Self-pay

## 2016-12-31 ENCOUNTER — Ambulatory Visit (HOSPITAL_COMMUNITY): Payer: Commercial Managed Care - HMO | Attending: Cardiovascular Disease

## 2016-12-31 DIAGNOSIS — I350 Nonrheumatic aortic (valve) stenosis: Secondary | ICD-10-CM

## 2016-12-31 DIAGNOSIS — I5030 Unspecified diastolic (congestive) heart failure: Secondary | ICD-10-CM | POA: Diagnosis not present

## 2016-12-31 DIAGNOSIS — R9439 Abnormal result of other cardiovascular function study: Secondary | ICD-10-CM | POA: Diagnosis not present

## 2017-03-07 DIAGNOSIS — D126 Benign neoplasm of colon, unspecified: Secondary | ICD-10-CM | POA: Diagnosis not present

## 2017-03-07 DIAGNOSIS — Z86010 Personal history of colonic polyps: Secondary | ICD-10-CM | POA: Diagnosis not present

## 2017-03-07 DIAGNOSIS — K5730 Diverticulosis of large intestine without perforation or abscess without bleeding: Secondary | ICD-10-CM | POA: Diagnosis not present

## 2017-03-11 DIAGNOSIS — D126 Benign neoplasm of colon, unspecified: Secondary | ICD-10-CM | POA: Diagnosis not present

## 2017-12-16 ENCOUNTER — Other Ambulatory Visit: Payer: Self-pay | Admitting: Family Medicine

## 2017-12-16 DIAGNOSIS — I10 Essential (primary) hypertension: Secondary | ICD-10-CM | POA: Diagnosis not present

## 2017-12-16 DIAGNOSIS — E890 Postprocedural hypothyroidism: Secondary | ICD-10-CM | POA: Diagnosis not present

## 2017-12-16 DIAGNOSIS — I350 Nonrheumatic aortic (valve) stenosis: Secondary | ICD-10-CM

## 2017-12-16 DIAGNOSIS — M8580 Other specified disorders of bone density and structure, unspecified site: Secondary | ICD-10-CM | POA: Diagnosis not present

## 2017-12-16 DIAGNOSIS — E559 Vitamin D deficiency, unspecified: Secondary | ICD-10-CM | POA: Diagnosis not present

## 2017-12-16 DIAGNOSIS — E7800 Pure hypercholesterolemia, unspecified: Secondary | ICD-10-CM | POA: Diagnosis not present

## 2017-12-16 DIAGNOSIS — Z1389 Encounter for screening for other disorder: Secondary | ICD-10-CM | POA: Diagnosis not present

## 2017-12-16 DIAGNOSIS — Z0000 Encounter for general adult medical examination without abnormal findings: Secondary | ICD-10-CM | POA: Diagnosis not present

## 2017-12-16 DIAGNOSIS — Z1231 Encounter for screening mammogram for malignant neoplasm of breast: Secondary | ICD-10-CM | POA: Diagnosis not present

## 2018-01-01 ENCOUNTER — Other Ambulatory Visit: Payer: Self-pay

## 2018-01-01 ENCOUNTER — Ambulatory Visit (HOSPITAL_COMMUNITY): Payer: Commercial Managed Care - HMO | Attending: Cardiology

## 2018-01-01 DIAGNOSIS — I082 Rheumatic disorders of both aortic and tricuspid valves: Secondary | ICD-10-CM | POA: Insufficient documentation

## 2018-01-01 DIAGNOSIS — E785 Hyperlipidemia, unspecified: Secondary | ICD-10-CM | POA: Insufficient documentation

## 2018-01-01 DIAGNOSIS — I350 Nonrheumatic aortic (valve) stenosis: Secondary | ICD-10-CM | POA: Diagnosis not present

## 2018-01-01 DIAGNOSIS — I10 Essential (primary) hypertension: Secondary | ICD-10-CM | POA: Insufficient documentation

## 2018-01-13 DIAGNOSIS — Z1231 Encounter for screening mammogram for malignant neoplasm of breast: Secondary | ICD-10-CM | POA: Diagnosis not present

## 2018-01-19 DIAGNOSIS — H2513 Age-related nuclear cataract, bilateral: Secondary | ICD-10-CM | POA: Diagnosis not present

## 2018-01-19 DIAGNOSIS — H524 Presbyopia: Secondary | ICD-10-CM | POA: Diagnosis not present

## 2018-02-04 ENCOUNTER — Encounter: Payer: Self-pay | Admitting: Cardiovascular Disease

## 2018-02-05 NOTE — H&P (View-Only) (Signed)
Cardiology Office Note   Date:  02/12/2018   ID:  Malley, Hauter 05/01/1941, MRN [id number]  PCP:  Gaynelle Arabian, MD  Cardiologist:   Jenkins Rouge, MD   No chief complaint on file.     History of Present Illness: Cheyenne Jones is a 77 y.o. female who presents for consultation regarding severe aortic stenosis . Referred by Dr Marisue Humble Reviewed echo from 01/01/18 EF 55-60% mean gradient 52 mmHg, peak 86 mmHg AVA .71 cm2. Progression of disease since 12/31/16 when mean gradient ws 37 mmHg and peak 70 mmHg. I take care of her husband She has noted more fatigue and some dyspnea over the last 6 months but felt it was just age. No chest pain Or history of CAD. She keeps her teeth in good shape. She has had contrast before with no reaction  Discussed the diagnosis of severe AS and natural history Need for heart cath and surgical referral. She is  Likely too healthy for TAVR consideration     Past Medical History:  Diagnosis Date  . Adenomatous colon polyp   . Allergy   . Aortic stenosis, moderate 12/2016   MOD/SEVERE  . 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 Arthritis   . Bronchitis, asthmatic   . Diverticulosis   . DJD (degenerative joint disease) 2014  . Hypercholesterolemia   . Hypertension   . Hypothyroidism   . Osteopenia   . Vitamin D deficiency 2013    Past Surgical History:  Procedure Laterality Date  . BREAST SURGERY Right    x 2  . COLONOSCOPY W/ POLYPECTOMY    . FINGER SURGERY Right    index finger  . TONSILLECTOMY    . 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 amoxicillin (AMOXIL) 500 MG capsule Take 500 mg by mouth as directed. Prior to dental procedures    . aspirin EC 325 MG tablet Take 1 tablet (325 mg total) by mouth 2 (two) times daily. 30 tablet 0   . Cholecalciferol 2000 UNITS TABS Take 1 tablet by mouth daily.    Marland Kitchen FIBER SELECT GUMMIES CHEW Chew 2 tablets by mouth daily.    . hydrochlorothiazide (HYDRODIURIL) 25 MG tablet Take 25 mg by mouth daily.    Marland Kitchen levothyroxine (SYNTHROID, LEVOTHROID) 100 MCG tablet Take 100 mcg by mouth daily before breakfast.     No current facility-administered medications for this visit.     Allergies:   Fenofibrate; Keflex [cephalexin]; Livalo [pitavastatin]; Lovastatin; and Pravastatin    Social History:  The patient  reports that  has never smoked. she has never used smokeless tobacco. She reports that she drinks about 1.2 oz of alcohol per week. She reports that she does not use drugs.   Family History:  The patient's family history includes Colon polyps in her brother; Ovarian cancer in her sister.    ROS:  Please see the history of present illness.   Otherwise, review of systems are positive for none.   All other systems are reviewed and negative.    PHYSICAL EXAM: VS:  BP (!) 154/70   Pulse 62   Ht 5' 3.5" (1.613 m)   Wt 148 lb 8 oz (67.4 kg)   BMI 25.89 kg/m  , BMI Body mass index is 25.89  kg/m. Affect appropriate Healthy:  appears stated age 55: normal Neck supple with no adenopathy JVP normal no bruits no thyromegaly Lungs clear with no wheezing and good diaphragmatic motion Heart:  S1/S2 diminished severe AS  murmur, no rub, gallop or click PMI normal Abdomen: benighn, BS positve, no tenderness, no AAA no bruit.  No HSM or HJR Distal pulses intact with no bruits No edema Neuro non-focal Skin warm and dry No muscular weakness    EKG:  09/13/13 SR rate 68 nonspecific lateral ST changes    Recent Labs: No results found for requested labs within last 8760 hours.    Lipid Panel No results found for: CHOL, TRIG, HDL, CHOLHDL, VLDL, LDLCALC, LDLDIRECT    Wt Readings from Last 3 Encounters:  02/12/18 148 lb 8 oz (67.4 kg)  12/09/16 153 lb (69.4 kg)  12/14/15 152 lb  (68.9 kg)      Other studies Reviewed: Additional studies/ records that were reviewed today include: Notes from [facility] primary care echo 2018 and 2019.    ASSESSMENT AND PLAN:  1.  Critical Aortic Stenosis labs done right and left heart cath scheduled for next week and surgical referral made to CVTS.   2. Thyroid on replacement normal TSH  3. HTN;  Well controlled hold diuretic day before cath    Current medicines are reviewed at length with the patient today.  The patient does not have concerns regarding medicines.  The following changes have been made:  no change  Labs/ tests ordered today include: Pre cath labs   Orders Placed This Encounter  Procedures  . Protime-INR  . Basic metabolic panel  . CBC with Differential/Platelet  . TSH  . Ambulatory referral to Cardiothoracic Surgery  . EKG 12-Lead     Disposition:   FU with CVTS after heart catheterization      Signed, Jenkins Rouge, MD  02/12/2018 9:46 AM    [facility] [place], [place], [place]  [address] Phone: [phone]; Fax: [phone]

## 2018-02-05 NOTE — Progress Notes (Signed)
Cardiology Office Note   Date:  02/12/2018   ID:  Cheyenne Jones, Cheyenne Jones 05/14/1941, MRN [id number]  PCP:  Cheyenne Arabian, MD  Cardiologist:   Cheyenne Rouge, MD   No chief complaint on file.     History of Present Illness: Cheyenne Jones is a 77 y.o. female who presents for consultation regarding severe aortic stenosis . Referred by Dr Cheyenne Jones Reviewed echo from 01/01/18 EF 55-60% mean gradient 52 mmHg, peak 86 mmHg AVA .71 cm2. Progression of disease since 12/31/16 when mean gradient ws 37 mmHg and peak 70 mmHg. I take care of her husband She has noted more fatigue and some dyspnea over the last 6 months but felt it was just age. No chest pain Or history of CAD. She keeps her teeth in good shape. She has had contrast before with no reaction  Discussed the diagnosis of severe AS and natural history Need for heart cath and surgical referral. She is  Likely too healthy for TAVR consideration     Past Medical History:  Diagnosis Date  . Adenomatous colon polyp   . Allergy   . Aortic stenosis, moderate 12/2016   MOD/SEVERE  . 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 Arthritis   . Bronchitis, asthmatic   . Diverticulosis   . DJD (degenerative joint disease) 2014  . Hypercholesterolemia   . Hypertension   . Hypothyroidism   . Osteopenia   . Vitamin D deficiency 2013    Past Surgical History:  Procedure Laterality Date  . BREAST SURGERY Right    x 2  . COLONOSCOPY W/ POLYPECTOMY    . FINGER SURGERY Right    index finger  . TONSILLECTOMY    . 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 amoxicillin (AMOXIL) 500 MG capsule Take 500 mg by mouth as directed. Prior to dental procedures    . aspirin EC 325 MG tablet Take 1 tablet (325 mg total) by mouth 2 (two) times daily. 30 tablet 0   . Cholecalciferol 2000 UNITS TABS Take 1 tablet by mouth daily.    Marland Kitchen FIBER SELECT GUMMIES CHEW Chew 2 tablets by mouth daily.    . hydrochlorothiazide (HYDRODIURIL) 25 MG tablet Take 25 mg by mouth daily.    Marland Kitchen levothyroxine (SYNTHROID, LEVOTHROID) 100 MCG tablet Take 100 mcg by mouth daily before breakfast.     No current facility-administered medications for this visit.     Allergies:   Fenofibrate; Keflex [cephalexin]; Livalo [pitavastatin]; Lovastatin; and Pravastatin    Social History:  The patient  reports that  has never smoked. she has never used smokeless tobacco. She reports that she drinks about 1.2 oz of alcohol per week. She reports that she does not use drugs.   Family History:  The patient's family history includes Colon polyps in her brother; Ovarian cancer in her sister.    ROS:  Please see the history of present illness.   Otherwise, review of systems are positive for none.   All other systems are reviewed and negative.    PHYSICAL EXAM: VS:  BP (!) 154/70   Pulse 62   Ht 5' 3.5" (1.613 m)   Wt 148 lb 8 oz (67.4 kg)   BMI 25.89 kg/m  , BMI Body mass index is 25.89  kg/m. Affect appropriate Healthy:  appears stated age 30: normal Neck supple with no adenopathy JVP normal no bruits no thyromegaly Lungs clear with no wheezing and good diaphragmatic motion Heart:  S1/S2 diminished severe AS  murmur, no rub, gallop or click PMI normal Abdomen: benighn, BS positve, no tenderness, no AAA no bruit.  No HSM or HJR Distal pulses intact with no bruits No edema Neuro non-focal Skin warm and dry No muscular weakness    EKG:  09/13/13 SR rate 68 nonspecific lateral ST changes    Recent Labs: No results found for requested labs within last 8760 hours.    Lipid Panel No results found for: CHOL, TRIG, HDL, CHOLHDL, VLDL, LDLCALC, LDLDIRECT    Wt Readings from Last 3 Encounters:  02/12/18 148 lb 8 oz (67.4 kg)  12/09/16 153 lb (69.4 kg)  12/14/15 152 lb  (68.9 kg)      Other studies Reviewed: Additional studies/ records that were reviewed today include: Notes from [facility] primary care echo 2018 and 2019.    ASSESSMENT AND PLAN:  1.  Critical Aortic Stenosis labs done right and left heart cath scheduled for next week and surgical referral made to CVTS.   2. Thyroid on replacement normal TSH  3. HTN;  Well controlled hold diuretic day before cath    Current medicines are reviewed at length with the patient today.  The patient does not have concerns regarding medicines.  The following changes have been made:  no change  Labs/ tests ordered today include: Pre cath labs   Orders Placed This Encounter  Procedures  . Protime-INR  . Basic metabolic panel  . CBC with Differential/Platelet  . TSH  . Ambulatory referral to Cardiothoracic Surgery  . EKG 12-Lead     Disposition:   FU with CVTS after heart catheterization      Signed, Cheyenne Rouge, MD  02/12/2018 9:46 AM    [facility] [place], [place], [place]  [address] Phone: [phone]; Fax: [phone]

## 2018-02-12 ENCOUNTER — Ambulatory Visit: Payer: Medicare HMO | Admitting: Cardiovascular Disease

## 2018-02-12 ENCOUNTER — Encounter: Payer: Self-pay | Admitting: Cardiovascular Disease

## 2018-02-12 VITALS — BP 154/70 | HR 62 | Ht 63.5 in | Wt 148.5 lb

## 2018-02-12 DIAGNOSIS — I350 Nonrheumatic aortic (valve) stenosis: Secondary | ICD-10-CM | POA: Diagnosis not present

## 2018-02-12 DIAGNOSIS — Z79899 Other long term (current) drug therapy: Secondary | ICD-10-CM

## 2018-02-12 NOTE — Patient Instructions (Addendum)
Medication Instructions:  Your physician recommends that you continue on your current medications as directed. Please refer to the Current Medication list given to you today.  Labwork: Your physician recommends that you have lab work today- BMET, CBC, PT/INR  Testing/Procedures: Your physician has requested that you have a cardiac catheterization. Cardiac catheterization is used to diagnose and/or treat various heart conditions. Doctors may recommend this procedure for a number of different reasons. The most common reason is to evaluate chest pain. Chest pain can be a symptom of coronary artery disease (CAD), and cardiac catheterization can show whether plaque is narrowing or blocking your heart's arteries. This procedure is also used to evaluate the valves, as well as measure the blood flow and oxygen levels in different parts of your heart. For further information please visit [email or web address]. Please follow instruction sheet, as given.  Follow-Up: Your physician wants you to follow-up in: 2  Months with Dr. Johnsie Cancel.  You have been referred to Dr. Roxy Manns, they will call you to schedule.    If you need a refill on your cardiac medications before your next appointment, please call your pharmacy.

## 2018-02-13 ENCOUNTER — Other Ambulatory Visit (HOSPITAL_COMMUNITY): Payer: Self-pay | Admitting: Cardiovascular Disease

## 2018-02-13 DIAGNOSIS — I350 Nonrheumatic aortic (valve) stenosis: Secondary | ICD-10-CM

## 2018-02-13 LAB — CBC WITH DIFFERENTIAL/PLATELET
Basophils Absolute: 0 10*3/uL (ref 0.0–0.2)
Basos: 1 %
EOS (ABSOLUTE): 0.1 10*3/uL (ref 0.0–0.4)
EOS: 2 %
HEMATOCRIT: 40.3 % (ref 34.0–46.6)
HEMOGLOBIN: 13.5 g/dL (ref 11.1–15.9)
Immature Grans (Abs): 0 10*3/uL (ref 0.0–0.1)
Immature Granulocytes: 0 %
LYMPHS ABS: 1.1 10*3/uL (ref 0.7–3.1)
Lymphs: 23 %
MCH: 31 pg (ref 26.6–33.0)
MCHC: 33.5 g/dL (ref 31.5–35.7)
MCV: 93 fL (ref 79–97)
Monocytes Absolute: 0.5 10*3/uL (ref 0.1–0.9)
Monocytes: 10 %
NEUTROS ABS: 2.9 10*3/uL (ref 1.4–7.0)
Neutrophils: 64 %
Platelets: 289 10*3/uL (ref 150–379)
RBC: 4.35 x10E6/uL (ref 3.77–5.28)
RDW: 13.6 % (ref 12.3–15.4)
WBC: 4.6 10*3/uL (ref 3.4–10.8)

## 2018-02-13 LAB — PROTIME-INR
INR: 1.1 (ref 0.8–1.2)
Prothrombin Time: 11.1 s (ref 9.1–12.0)

## 2018-02-13 LAB — BASIC METABOLIC PANEL
BUN / CREAT RATIO: 29 — AB (ref 12–28)
BUN: 16 mg/dL (ref 8–27)
CO2: 27 mmol/L (ref 20–29)
CREATININE: 0.55 mg/dL — AB (ref 0.57–1.00)
Calcium: 10 mg/dL (ref 8.7–10.3)
Chloride: 100 mmol/L (ref 96–106)
GFR calc non Af Amer: 92 mL/min/{1.73_m2} (ref >59)
GFR, EST AFRICAN AMERICAN: 106 mL/min/{1.73_m2} (ref >59)
Glucose: 102 mg/dL — ABNORMAL HIGH (ref 65–99)
Potassium: 4.7 mmol/L (ref 3.5–5.2)
Sodium: 141 mmol/L (ref 134–144)

## 2018-02-13 LAB — TSH: TSH: 0.835 u[IU]/mL (ref 0.450–4.500)

## 2018-02-16 ENCOUNTER — Ambulatory Visit (HOSPITAL_COMMUNITY)
Admission: RE | Admit: 2018-02-16 | Discharge: 2018-02-16 | Disposition: A | Discharge status: Home / Self Care | Payer: Medicare HMO | Source: Ambulatory Visit | Attending: Cardiology | Admitting: Cardiology

## 2018-02-16 ENCOUNTER — Ambulatory Visit (HOSPITAL_COMMUNITY)
Admission: RE | Disposition: A | Discharge status: Home / Self Care | Payer: Self-pay | Source: Ambulatory Visit | Attending: Cardiology

## 2018-02-16 DIAGNOSIS — E559 Vitamin D deficiency, unspecified: Secondary | ICD-10-CM | POA: Insufficient documentation

## 2018-02-16 DIAGNOSIS — E039 Hypothyroidism, unspecified: Secondary | ICD-10-CM | POA: Insufficient documentation

## 2018-02-16 DIAGNOSIS — Z96641 Presence of right artificial hip joint: Secondary | ICD-10-CM | POA: Diagnosis not present

## 2018-02-16 DIAGNOSIS — Z86010 Personal history of colonic polyps: Secondary | ICD-10-CM | POA: Diagnosis not present

## 2018-02-16 DIAGNOSIS — Z881 Allergy status to other antibiotic agents status: Secondary | ICD-10-CM | POA: Insufficient documentation

## 2018-02-16 DIAGNOSIS — Z8371 Family history of colonic polyps: Secondary | ICD-10-CM | POA: Insufficient documentation

## 2018-02-16 DIAGNOSIS — M1990 Unspecified osteoarthritis, unspecified site: Secondary | ICD-10-CM | POA: Insufficient documentation

## 2018-02-16 DIAGNOSIS — Z8041 Family history of malignant neoplasm of ovary: Secondary | ICD-10-CM | POA: Diagnosis not present

## 2018-02-16 DIAGNOSIS — Z888 Allergy status to other drugs, medicaments and biological substances status: Secondary | ICD-10-CM | POA: Diagnosis not present

## 2018-02-16 DIAGNOSIS — Z79899 Other long term (current) drug therapy: Secondary | ICD-10-CM | POA: Insufficient documentation

## 2018-02-16 DIAGNOSIS — Z7982 Long term (current) use of aspirin: Secondary | ICD-10-CM | POA: Diagnosis not present

## 2018-02-16 DIAGNOSIS — I350 Nonrheumatic aortic (valve) stenosis: Secondary | ICD-10-CM | POA: Diagnosis not present

## 2018-02-16 DIAGNOSIS — I10 Essential (primary) hypertension: Secondary | ICD-10-CM | POA: Diagnosis not present

## 2018-02-16 DIAGNOSIS — Z9889 Other specified postprocedural states: Secondary | ICD-10-CM | POA: Insufficient documentation

## 2018-02-16 DIAGNOSIS — Z79890 Hormone replacement therapy (postmenopausal): Secondary | ICD-10-CM | POA: Insufficient documentation

## 2018-02-16 HISTORY — PX: RIGHT/LEFT HEART CATH AND CORONARY ANGIOGRAPHY: CATH118266

## 2018-02-16 LAB — POCT I-STAT 3, VENOUS BLOOD GAS (G3P V)
ACID-BASE EXCESS: 5 mmol/L — AB (ref 0.0–2.0)
BICARBONATE: 30.4 mmol/L — AB (ref 20.0–28.0)
O2 SAT: 78 %
PO2 VEN: 43 mmHg (ref 32.0–45.0)
TCO2: 32 mmol/L (ref 22–32)
pCO2, Ven: 50.1 mmHg (ref 44.0–60.0)
pH, Ven: 7.391 (ref 7.250–7.430)

## 2018-02-16 LAB — POCT I-STAT 3, ART BLOOD GAS (G3+)
ACID-BASE EXCESS: 1 mmol/L (ref 0.0–2.0)
Bicarbonate: 26.8 mmol/L (ref 20.0–28.0)
O2 SAT: 97 %
PCO2 ART: 46.1 mmHg (ref 32.0–48.0)
TCO2: 28 mmol/L (ref 22–32)
pH, Arterial: 7.372 (ref 7.350–7.450)
pO2, Arterial: 91 mmHg (ref 83.0–108.0)

## 2018-02-16 SURGERY — RIGHT/LEFT HEART CATH AND CORONARY ANGIOGRAPHY
Anesthesia: LOCAL

## 2018-02-16 MED ORDER — SODIUM CHLORIDE 0.9 % IV SOLN: 250.0000 mL | INTRAVENOUS | Status: DC | PRN

## 2018-02-16 MED ORDER — SODIUM CHLORIDE 0.9 % WEIGHT BASED INFUSION
3.0000 mL/kg/h | INTRAVENOUS | Status: AC
  Administered 2018-02-16: 3 mL/kg/h via INTRAVENOUS

## 2018-02-16 MED ORDER — MIDAZOLAM HCL 2 MG/2ML IJ SOLN
INTRAMUSCULAR | Status: DC | PRN
  Administered 2018-02-16: 1 mg via INTRAVENOUS

## 2018-02-16 MED ORDER — HEPARIN SODIUM (PORCINE) 1000 UNIT/ML IJ SOLN
INTRAMUSCULAR | Status: DC | PRN
  Administered 2018-02-16: 3500 [IU] via INTRAVENOUS

## 2018-02-16 MED ORDER — IOPAMIDOL (ISOVUE-370) INJECTION 76%
INTRAVENOUS | Status: DC | PRN
  Administered 2018-02-16: 60 mL via INTRAVENOUS

## 2018-02-16 MED ORDER — ASPIRIN 81 MG PO CHEW: 81.0000 mg | CHEWABLE_TABLET | ORAL | Status: DC

## 2018-02-16 MED ORDER — SODIUM CHLORIDE 0.9% FLUSH: 3.0000 mL | INTRAVENOUS | Status: DC | PRN

## 2018-02-16 MED ORDER — SODIUM CHLORIDE 0.9% FLUSH: 3.0000 mL | Freq: Two times a day (BID) | INTRAVENOUS | Status: DC

## 2018-02-16 MED ORDER — IOPAMIDOL (ISOVUE-370) INJECTION 76%
INTRAVENOUS | Status: AC
  Filled 2018-02-16: qty 100

## 2018-02-16 MED ORDER — HEPARIN (PORCINE) IN NACL 2-0.9 UNIT/ML-% IJ SOLN
INTRAMUSCULAR | Status: AC | PRN
  Administered 2018-02-16: 1000 mL

## 2018-02-16 MED ORDER — VERAPAMIL HCL 2.5 MG/ML IV SOLN
INTRAVENOUS | Status: AC
  Filled 2018-02-16: qty 2

## 2018-02-16 MED ORDER — LIDOCAINE HCL (PF) 1 % IJ SOLN
INTRAMUSCULAR | Status: DC | PRN
  Administered 2018-02-16 (×2): 2 mL

## 2018-02-16 MED ORDER — FENTANYL CITRATE (PF) 100 MCG/2ML IJ SOLN
INTRAMUSCULAR | Status: AC
  Filled 2018-02-16: qty 2

## 2018-02-16 MED ORDER — HEPARIN SODIUM (PORCINE) 1000 UNIT/ML IJ SOLN
INTRAMUSCULAR | Status: AC
  Filled 2018-02-16: qty 1

## 2018-02-16 MED ORDER — MIDAZOLAM HCL 2 MG/2ML IJ SOLN
INTRAMUSCULAR | Status: AC
  Filled 2018-02-16: qty 2

## 2018-02-16 MED ORDER — LIDOCAINE HCL 1 % IJ SOLN
INTRAMUSCULAR | Status: AC
  Filled 2018-02-16: qty 20

## 2018-02-16 MED ORDER — VERAPAMIL HCL 2.5 MG/ML IV SOLN
INTRAVENOUS | Status: DC | PRN
  Administered 2018-02-16: 10 mL via INTRA_ARTERIAL

## 2018-02-16 MED ORDER — HEPARIN (PORCINE) IN NACL 2-0.9 UNIT/ML-% IJ SOLN
INTRAMUSCULAR | Status: AC
  Filled 2018-02-16: qty 1000

## 2018-02-16 MED ORDER — FENTANYL CITRATE (PF) 100 MCG/2ML IJ SOLN
INTRAMUSCULAR | Status: DC | PRN
  Administered 2018-02-16: 25 ug via INTRAVENOUS

## 2018-02-16 MED ORDER — SODIUM CHLORIDE 0.9 % WEIGHT BASED INFUSION
1.0000 mL/kg/h | INTRAVENOUS | Status: DC
  Administered 2018-02-16: 1 mL/kg/h via INTRAVENOUS

## 2018-02-16 MED ORDER — SODIUM CHLORIDE 0.9 % WEIGHT BASED INFUSION: 1.0000 mL/kg/h | INTRAVENOUS | Status: DC

## 2018-02-16 SURGICAL SUPPLY — 13 items
CATH 5FR JL3.5 JR4 ANG PIG MP (CATHETERS) ×1 IMPLANT
CATH BALLN WEDGE 5F 110CM (CATHETERS) ×1 IMPLANT
CATH INFINITI 5FR AL1 (CATHETERS) ×1 IMPLANT
DEVICE RAD TR BAND REGULAR (VASCULAR PRODUCTS) ×1 IMPLANT
GLIDESHEATH SLEND SS 6F .021 (SHEATH) ×1 IMPLANT
GUIDEWIRE INQWIRE 1.5J.035X260 (WIRE) IMPLANT
INQWIRE 1.5J .035X260CM (WIRE) ×2
KIT HEART LEFT (KITS) ×2 IMPLANT
PACK CARDIAC CATHETERIZATION (CUSTOM PROCEDURE TRAY) ×2 IMPLANT
SHEATH GLIDE SLENDER 4/5FR (SHEATH) ×1 IMPLANT
TRANSDUCER W/STOPCOCK (MISCELLANEOUS) ×2 IMPLANT
TUBING CIL FLEX 10 FLL-RA (TUBING) ×2 IMPLANT
WIRE EMERALD ST .035X260CM (WIRE) ×1 IMPLANT

## 2018-02-16 NOTE — Interval H&P Note (Signed)
History and Physical Interval Note:  02/16/2018 10:58 AM  Cheyenne Jones  has presented today for surgery, with the diagnosis of as  The various methods of treatment have been discussed with the patient and family. After consideration of risks, benefits and other options for treatment, the patient has consented to  Procedure(s): RIGHT/LEFT HEART CATH AND CORONARY ANGIOGRAPHY (N/A) as a surgical intervention .  The patient's history has been reviewed, patient examined, no change in status, stable for surgery.  I have reviewed the patient's chart and labs.  Questions were answered to the patient's satisfaction.     Collier [facility] 02/16/2018 10:58 AM

## 2018-02-16 NOTE — Progress Notes (Signed)
Site area: left brachial  Site Prior to Removal:  Level 0  Pressure Applied For 15 MINUTES    Minutes Beginning at 15 min  Manual:   Yes.    Patient Status During Pull:  stable  Post Pull bracial Site:  Level 0  Post Pull Instructions Given:  Yes.    Post Pull Pulses Present:  Yes.    Dressing Applied:  Yes.    Comments:  Site [employer]

## 2018-02-16 NOTE — Discharge Instructions (Signed)

## 2018-02-17 ENCOUNTER — Encounter (HOSPITAL_COMMUNITY): Payer: Self-pay | Admitting: Cardiology

## 2018-02-17 MED FILL — Lidocaine HCl Local Inj 1%: INTRAMUSCULAR | Qty: 20 | Status: AC

## 2018-02-17 MED FILL — Heparin Sodium (Porcine) 2 Unit/ML in Sodium Chloride 0.9%: INTRAMUSCULAR | Qty: 1000 | Status: AC

## 2018-02-23 ENCOUNTER — Encounter: Payer: Self-pay | Admitting: Thoracic Surgery (Cardiothoracic Vascular Surgery)

## 2018-02-23 ENCOUNTER — Institutional Professional Consult (permissible substitution): Payer: Medicare HMO | Admitting: Thoracic Surgery (Cardiothoracic Vascular Surgery)

## 2018-02-23 ENCOUNTER — Other Ambulatory Visit: Payer: Self-pay | Admitting: *Deleted

## 2018-02-23 VITALS — BP 150/77 | HR 75 | Resp 20 | Ht 63.5 in | Wt 148.0 lb

## 2018-02-23 DIAGNOSIS — I350 Nonrheumatic aortic (valve) stenosis: Secondary | ICD-10-CM | POA: Diagnosis not present

## 2018-02-23 NOTE — Patient Instructions (Signed)
Continue all previous medications without any changes at this time  Stop taking aspirin 2 weeks prior to surgery

## 2018-02-23 NOTE — Progress Notes (Signed)
[facility]  CARDIOTHORACIC SURGERY CONSULTATION REPORT  Referring Provider is Josue Hector, MD PCP is Gaynelle Arabian, MD  Chief Complaint  Patient presents with  . Aortic Stenosis    Surgical eval, Cardiac Cath 02/16/18, ECHO 01/01/18    HPI:  Patient is a 77 year old female with history of aortic stenosis, hypertension, hypercholesterolemia, asthmatic bronchitis, and degenerative joint disease who has been referred for surgical consultation to discuss treatment options for management of severe symptomatic aortic stenosis.  The patient has been physically active and healthy most of her adult life.  She was noted to have a heart murmur on physical exam and diagnosed with aortic stenosis several years ago.  She has been followed carefully for the past several years by Dr. Johnsie Cancel.  Serial echocardiograms demonstrated progression in disease, and recently the patient has begun to experience increased fatigue with decreased exercise tolerance and mild exertional shortness of breath.  Follow-up echocardiogram performed January 01, 2018 revealed severe aortic stenosis.  Peak velocity across the aortic valve measured 4.6 m/s corresponding to mean transvalvular gradient estimated 52 mmHg.  The DVI was quite low at 0.19.  Left ventricular systolic function remain normal at 60%.  The patient subsequently underwent left and right heart catheterization on February 16, 2018.  She was found to have normal coronary artery anatomy with no significant coronary artery disease.  Mean transvalvular gradient across the aortic valve was measured 38 mmHg with aortic valve area calculated 0.72 cm.  Pulmonary artery pressures were normal.  The patient was referred for elective surgical consultation.  Patient is married and lives locally in [place] with her husband.  She remains physically active.  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  She reports no specific physical limitations.  She has mild unsteady gait related to previous right hip replacement.  She otherwise walks without difficulty.  She complains of progression of exertional fatigue.  She states that she gets mildly short of breath with more strenuous exertion.  She otherwise denies shortness of breath.  She has not had chest pain or chest tightness either with activity or at rest.  She denies any history of PND, orthopnea, lower extremity edema or syncope.  She states that she occasionally gets dizzy transiently when she stands up from a sitting position.  Past Medical History:  Diagnosis Date  . Adenomatous colon polyp   . Allergy   . Aortic stenosis   . Arthritis   . Bronchitis, asthmatic   . Diverticulosis   . DJD (degenerative joint disease)   . Hypercholesterolemia   . Hypertension   . Hypothyroidism   . Osteopenia   . Vitamin D deficiency     Past Surgical History:  Procedure Laterality Date  . BREAST SURGERY Right    x 2  . COLONOSCOPY W/ POLYPECTOMY    . FINGER SURGERY Right    index finger  . RIGHT/LEFT HEART CATH AND CORONARY ANGIOGRAPHY N/A 02/16/2018   Procedure: RIGHT/LEFT HEART CATH AND CORONARY ANGIOGRAPHY;  Surgeon: Martinique, Peter M, MD;  Location: [facility];  Service: Cardiovascular;  Laterality: N/A;  . TONSILLECTOMY    . TOTAL HIP ARTHROPLASTY Right 09/06/2013   Procedure: TOTAL HIP ARTHROPLASTY;  Surgeon: Kerin Salen, MD;  Location: [facility];  Service: Orthopedics;  Laterality: Right;    Family History  Problem Relation Age of Onset  .  Ovarian cancer Sister   . Colon polyps Brother     Social History   Socioeconomic History  . Marital status: Married    Spouse name: Not on file  . Number of children: Not on file  . Years of education: Not on file  . Highest education level: Not on file  Social Needs  . Financial resource strain: Not on file  . Food insecurity -  worry: Not on file  . Food insecurity - inability: Not on file  . Transportation needs - medical: Not on file  . Transportation needs - non-medical: Not on file  Occupational History  . Occupation: RETIRED  Tobacco Use  . Smoking status: Never Smoker  . Smokeless tobacco: Never Used  Substance and Sexual Activity  . Alcohol use: Yes    Alcohol/week: 1.2 oz    Types: 2 Glasses of wine per week    Comment: occasional  . Drug use: No  . Sexual activity: Not on file  Other Topics Concern  . Not on file  Social History Narrative  . Not on file    Current Outpatient Medications  Medication Sig Dispense Refill  . amoxicillin (AMOXIL) 500 MG capsule Take 2,000 mg by mouth as directed. Prior to dental procedures     . aspirin EC 81 MG tablet Take 81 mg by mouth daily.    . Calcium Carbonate-Vitamin D (CALCIUM 500 + D PO) Take 2 tablets by mouth daily.    . Cholecalciferol (VITAMIN D) 2000 units CAPS Take 2,000 Units by mouth daily.    Marland Kitchen FIBER SELECT GUMMIES CHEW Chew 1 tablet by mouth daily.     . hydrochlorothiazide (HYDRODIURIL) 25 MG tablet Take 25 mg by mouth daily.    Marland Kitchen ibuprofen (ADVIL,MOTRIN) 200 MG tablet Take 200 mg by mouth daily as needed for headache or moderate pain.    Marland Kitchen levothyroxine (SYNTHROID, LEVOTHROID) 100 MCG tablet Take 100 mcg by mouth daily before breakfast.     No current facility-administered medications for this visit.     Allergies  Allergen Reactions  . Fenofibrate Other (See Comments)    LEG ACHES, 09/2012, SIDE EFFECTS  . Keflex [Cephalexin] Other (See Comments)    LEG ACHES, 09/2012, SIDE EFFECTS  . Livalo [Pitavastatin] Other (See Comments)    LEG ACHES  . Lovastatin Other (See Comments)    LEG ACHES, SIDE EFFECTS  . Pravastatin Other (See Comments)    LEG ACHES, SIDE EFFECTS      Review of Systems:   General:  normal appetite, decreased energy, no weight gain, no weight loss, no fever  Cardiac:  no chest pain with exertion, no chest pain  at rest, +SOB with strenuous exertion, no resting SOB, no PND, no orthopnea, no palpitations, no arrhythmia, no atrial fibrillation, no LE edema, + dizzy spells, no syncope  Respiratory:  no shortness of breath, no home oxygen, no productive cough, no dry cough, no bronchitis, no wheezing, no hemoptysis, no asthma, no pain with inspiration or cough, no sleep apnea, no CPAP at night  GI:   no difficulty swallowing, no reflux, no frequent heartburn, no hiatal hernia, no abdominal pain, occasional constipation, no diarrhea, no hematochezia, no hematemesis, no melena  GU:   no dysuria,  + frequency, no urinary tract infection, no hematuria, no kidney stones, no kidney disease  Vascular:  no pain suggestive of claudication, no pain in feet, no leg cramps, + varicose veins, no DVT, no non-healing foot ulcer  Neuro:  no stroke, no TIA's, no seizures, no headaches, no temporary blindness one eye,  no slurred speech, no peripheral neuropathy, no chronic pain, very mild instability of gait, no memory/cognitive dysfunction  Musculoskeletal: + arthritis, no joint swelling, no myalgias, minor difficulty walking, normal mobility   Skin:   no rash, no itching, no skin infections, no pressure sores or ulcerations  Psych:   no anxiety, no depression, no nervousness, no unusual recent stress  Eyes:   no blurry vision, no floaters, no recent vision changes, + wears glasses or contacts  ENT:   no hearing loss, no loose or painful teeth, no dentures, last saw dentist 12/17/2017  Hematologic:  + easy bruising, no abnormal bleeding, no clotting disorder, no frequent epistaxis  Endocrine:  no diabetes, does not check CBG's at home           Physical Exam:   BP (!) 150/77   Pulse 75   Resp 20   Ht 5' 3.5" (1.613 m)   Wt 148 lb (67.1 kg)   SpO2 97% Comment: RA  BMI 25.81 kg/m   General:    well-appearing  HEENT:  Unremarkable   Neck:   no JVD, no bruits, no adenopathy   Chest:   clear to auscultation,  symmetrical breath sounds, no wheezes, no rhonchi   CV:   RRR, grade III/VI crescendo/decrescendo murmur heard best at RSB,  no diastolic murmur  Abdomen:  soft, non-tender, no masses   Extremities:  warm, well-perfused, pulses diminished but palpable, no LE edema  Rectal/GU  Deferred  Neuro:   Grossly non-focal and symmetrical throughout  Skin:   Clean and dry, no rashes, no breakdown   Diagnostic Tests:  Transthoracic Echocardiography  Patient:    Cheyenne Jones, Erker MR #:       [id number] Study Date: 01/01/2018 Gender:     F Age:        76 Height:     162.6 cm Weight:     69.4 kg BSA:        1.79 m^2 Pt. Status: Room:   [facility] Abrahams [address] [place] [address]  ATTENDING    Ena Dawley, M.D.  PERFORMING   [facility], Outpatient  SONOGRAPHER  [facility], RDCS  cc:  ------------------------------------------------------------------- LV EF: 55% -   60%  ------------------------------------------------------------------- Indications:      Aortic Stenosis (I35.0).  ------------------------------------------------------------------- History:   PMH:   Aortic valve disease.  Risk factors: Hypertension. Dyslipidemia.  ------------------------------------------------------------------- Study Conclusions  - Left ventricle: The cavity size was normal. Systolic function was   normal. The estimated ejection fraction was in the range of 55%   to 60%. Wall motion was normal; there were no regional wall   motion abnormalities. Doppler parameters are consistent with   abnormal left ventricular relaxation (grade 1 diastolic   dysfunction). Doppler parameters are consistent with elevated   ventricular end-diastolic filling pressure. - Aortic valve: Trileaflet; severely thickened, severely calcified   leaflets. Valve mobility was restricted. There was critical   stenosis. Mean gradient (S): 52 mm Hg. Peak gradient (S): 86 mm   Hg. Valve area  (VTI): 0.71 cm^2. Valve area (Vmax): 0.71 cm^2.   Valve area (Vmean): 0.62 cm^2. - Mitral valve: There was no regurgitation. - Right ventricle: The cavity size was normal. Wall thickness was   normal. Systolic function was normal. - Tricuspid valve: There was mild regurgitation. - Pulmonary arteries: Systolic pressure was within the  normal   range. - Inferior vena cava: The vessel was normal in size. - Pericardium, extracardiac: There was no pericardial effusion.  Impressions:  - Critical aortic stenosis, preserved LVEF.  ------------------------------------------------------------------- Study data:  Comparison was made to the study of 12/31/2016.  Study status:  Routine.  Procedure:  Transthoracic echocardiography. Image quality was adequate.          Transthoracic echocardiography.  M-mode, complete 2D, 3D, spectral Doppler, and color Doppler.  Birthdate:  Patient birthdate: 1941/08/27.  Age: Patient is 77 yr old.  Sex:  Gender: female.    BMI: 26.2 kg/m^2. Blood pressure:     162/94  Patient status:  Outpatient.  Study date:  Study date: 01/01/2018. Study time: 09:41 AM.  Location: [facility]  -------------------------------------------------------------------  ------------------------------------------------------------------- Left ventricle:  The cavity size was normal. Systolic function was normal. The estimated ejection fraction was in the range of 55% to 60%. Wall motion was normal; there were no regional wall motion abnormalities. Doppler parameters are consistent with abnormal left ventricular relaxation (grade 1 diastolic dysfunction). Doppler parameters are consistent with elevated ventricular end-diastolic filling pressure.  ------------------------------------------------------------------- Aortic valve:   Trileaflet; severely thickened, severely calcified leaflets. Valve mobility was restricted.  Doppler:   There was critical stenosis.   There was  no regurgitation.    VTI ratio of LVOT to aortic valve: 0.19. Valve area (VTI): 0.71 cm^2. Indexed valve area (VTI): 0.4 cm^2/m^2. Peak velocity ratio of LVOT to aortic valve: 0.19. Valve area (Vmax): 0.71 cm^2. Indexed valve area (Vmax): 0.4 cm^2/m^2. Mean velocity ratio of LVOT to aortic valve: 0.16. Valve area (Vmean): 0.62 cm^2. Indexed valve area (Vmean): 0.35 cm^2/m^2.    Mean gradient (S): 52 mm Hg. Peak gradient (S): 86 mm Hg.  ------------------------------------------------------------------- Aorta:  Aortic root: The aortic root was normal in size.  ------------------------------------------------------------------- Mitral valve:   Structurally normal valve.   Mobility was not restricted.  Doppler:  Transvalvular velocity was within the normal range. There was no evidence for stenosis. There was no regurgitation.    Peak gradient (D): 3 mm Hg.  ------------------------------------------------------------------- Left atrium:  The atrium was normal in size.  ------------------------------------------------------------------- Right ventricle:  The cavity size was normal. Wall thickness was normal. Systolic function was normal.  ------------------------------------------------------------------- Pulmonic valve:    Structurally normal valve.   Cusp separation was normal.  Doppler:  Transvalvular velocity was within the normal range. There was no evidence for stenosis. There was no regurgitation.  ------------------------------------------------------------------- Tricuspid valve:   Structurally normal valve.    Doppler: Transvalvular velocity was within the normal range. There was mild regurgitation.  ------------------------------------------------------------------- Pulmonary artery:   The main pulmonary artery was normal-sized. Systolic pressure was within the normal range.  ------------------------------------------------------------------- Right atrium:  The  atrium was normal in size.  ------------------------------------------------------------------- Pericardium:  There was no pericardial effusion.  ------------------------------------------------------------------- Systemic veins: Inferior vena cava: The vessel was normal in size.  ------------------------------------------------------------------- Measurements   Left ventricle                           Value          Reference  LV ID, ED, PLAX chordal          (L)     39.8  mm       43 - 52  LV ID, ES, PLAX chordal  25.6  mm       23 - 38  LV fx shortening, PLAX chordal           36    %        >=29  LV PW thickness, ED                      12.2  mm       ----------  IVS/LV PW ratio, ED                      0.82           <=1.3  Stroke volume, 2D                        91    ml       ----------  Stroke volume/bsa, 2D                    51    ml/m^2   ----------  LV e&', lateral                           5.55  cm/s     ----------  LV E/e&', lateral                         15.48          ----------  LV e&', medial                            7.94  cm/s     ----------  LV E/e&', medial                          10.82          ----------  LV e&', average                           6.75  cm/s     ----------  LV E/e&', average                         12.74          ----------    Ventricular septum                       Value          Reference  IVS thickness, ED                        9.96  mm       ----------    LVOT                                     Value          Reference  LVOT ID, S                               22    mm       ----------  LVOT area  3.8   cm^2     ----------  LVOT peak velocity, S                    87.2  cm/s     ----------  LVOT mean velocity, S                    55    cm/s     ----------  LVOT VTI, S                              24    cm       ----------    Aortic valve                             Value           Reference  Aortic valve peak velocity, S            464   cm/s     ----------  Aortic valve mean velocity, S            339   cm/s     ----------  Aortic valve VTI, S                      129   cm       ----------  Aortic mean gradient, S                  52    mm Hg    ----------  Aortic peak gradient, S                  86    mm Hg    ----------  VTI ratio, LVOT/AV                       0.19           ----------  Aortic valve area, VTI                   0.71  cm^2     ----------  Aortic valve area/bsa, VTI               0.4   cm^2/m^2 ----------  Velocity ratio, peak, LVOT/AV            0.19           ----------  Aortic valve area, peak velocity         0.71  cm^2     ----------  Aortic valve area/bsa, peak              0.4   cm^2/m^2 ----------  velocity  Velocity ratio, mean, LVOT/AV            0.16           ----------  Aortic valve area, mean velocity         0.62  cm^2     ----------  Aortic valve area/bsa, mean              0.35  cm^2/m^2 ----------  velocity    Aorta                                    Value  Reference  Aortic root ID, ED                       28    mm       ----------  Ascending aorta ID, A-P, S               30    mm       ----------    Left atrium                              Value          Reference  LA ID, A-P, ES                           36    mm       ----------  LA ID/bsa, A-P                           2.02  cm/m^2   <=2.2  LA volume, S                             51.3  ml       ----------  LA volume/bsa, S                         28.7  ml/m^2   ----------  LA volume, ES, 1-p A4C                   38.8  ml       ----------  LA volume/bsa, ES, 1-p A4C               21.7  ml/m^2   ----------  LA volume, ES, 1-p A2C                   56.7  ml       ----------  LA volume/bsa, ES, 1-p A2C               31.8  ml/m^2   ----------    Mitral valve                             Value          Reference  Mitral E-wave peak velocity              85.9  cm/s      ----------  Mitral A-wave peak velocity              114   cm/s     ----------  Mitral deceleration time         (H)     299   ms       150 - 230  Mitral peak gradient, D                  3     mm Hg    ----------  Mitral E/A ratio, peak                   0.8            ----------    Tricuspid valve  Value          Reference  Tricuspid regurg peak velocity           236   cm/s     ----------  Tricuspid peak RV-RA gradient            22    mm Hg    ----------    Right atrium                             Value          Reference  RA ID, S-I, ES, A4C              (H)     55.2  mm       34 - 49  RA area, ES, A4C                         18.9  cm^2     8.3 - 19.5  RA volume, ES, A/L                       53    ml       ----------  RA volume/bsa, ES, A/L                   29.7  ml/m^2   ----------    Right ventricle                          Value          Reference  TAPSE                                    27.3  mm       ----------  RV s&', lateral, S                        13.5  cm/s     ----------  Legend: (L)  and  (H)  mark values outside specified reference range.  ------------------------------------------------------------------- Prepared and Electronically Authenticated by  Ena Dawley, M.D. 2019-01-03T11:08:29   RIGHT/LEFT HEART CATH AND CORONARY ANGIOGRAPHY  Conclusion     The left ventricular systolic function is normal.  LV end diastolic pressure is normal.  The left ventricular ejection fraction is greater than 65% by visual estimate.  There is severe aortic valve stenosis.   1. Normal coronary anatomy 2. Severe aortic stenosis. Mean gradient 30 mm Hg. Valve area 0.72 cm squared, index 0.42 3. Normal LV filling pressures. 4. Normal right heart pressures 5. Normal cardiac output.  Plan: referral for AVR.   Indications   Aortic stenosis, severe [I35.0 (ICD-10-CM)]  Procedural Details/Technique   Technical Details  Indication: 77 yo WF with severe aortic stenosis  Procedural Details: The right wrist and left brachial area were prepped, draped, and anesthetized with 1% lidocaine. Using the modified Seldinger technique a 6 Fr slender sheath was placed in the right radial artery and a 5 French sheath was placed in the left brachial vein. A Swan-Ganz catheter was used for the right heart catheterization. Standard protocol was followed for recording of right heart pressures and sampling of oxygen saturations. Fick cardiac output was calculated. Standard Judkins catheters were used for selective coronary angiography and  left ventriculography. There were no immediate procedural complications. The patient was transferred to the post catheterization recovery area for further monitoring.  Contrast: 60 cc   Estimated blood loss <50 mL.  During this procedure the patient was administered the following to achieve and maintain moderate conscious sedation: Versed 1 mg, Fentanyl 25 mcg, while the patient's heart rate, blood pressure, and oxygen saturation were continuously monitored. The period of conscious sedation was 36 minutes, of which I was present face-to-face 100% of this time.  Complications   Complications documented before study signed (02/16/2018 11:45 AM EST)    No complications were associated with this study.  Documented by Martinique, Peter M, MD - 02/16/2018 11:37 AM EST    Coronary Findings   Diagnostic  Dominance: Right  Left Main  Vessel was injected. Vessel is normal in caliber. Vessel is angiographically normal.  Left Anterior Descending  Vessel was injected. Vessel is normal in caliber. Vessel is angiographically normal.  Left Circumflex  Vessel was injected. Vessel is normal in caliber. Vessel is angiographically normal.  Right Coronary Artery  Vessel was injected. Vessel is normal in caliber. Vessel is angiographically normal.  Intervention   No interventions have been documented.  Wall  Motion              Left Heart   Left Ventricle The left ventricular size is normal. The left ventricular systolic function is normal. LV end diastolic pressure is normal. The left ventricular ejection fraction is greater than 65% by visual estimate. No regional wall motion abnormalities.  Aortic Valve There is severe aortic valve stenosis. The aortic valve is calcified. There is restricted aortic valve motion.  Coronary Diagrams   Diagnostic Diagram       Implants     No implant documentation for this case.  MERGE Images   Show images for CARDIAC CATHETERIZATION   Link to Procedure Log   Procedure Log    Hemo Data    Most Recent Value  Fick Cardiac Output 4.79 L/min  Fick Cardiac Output Index 2.78 (L/min)/BSA  Aortic Mean Gradient 30 mmHg  Aortic Peak Gradient 29 mmHg  Aortic Valve Area 0.72  Aortic Value Area Index 0.42 cm2/BSA  RA A Wave 4 mmHg  RA V Wave 3 mmHg  RA Mean 2 mmHg  RV Systolic Pressure 28 mmHg  RV Diastolic Pressure 0 mmHg  RV EDP 4 mmHg  PA Systolic Pressure 24 mmHg  PA Diastolic Pressure 5 mmHg  PA Mean 13 mmHg  PW A Wave 10 mmHg  PW V Wave 9 mmHg  PW Mean 6 mmHg  AO Systolic Pressure 962 mmHg  AO Diastolic Pressure 67 mmHg  AO Mean 229 mmHg  LV Systolic Pressure 798 mmHg  LV Diastolic Pressure -1 mmHg  LV EDP 18 mmHg  Arterial Occlusion Pressure Extended Systolic Pressure 921 mmHg  Arterial Occlusion Pressure Extended Diastolic Pressure 68 mmHg  Arterial Occlusion Pressure Extended Mean Pressure 114 mmHg  Left Ventricular Apex Extended Systolic Pressure 194 mmHg  Left Ventricular Apex Extended Diastolic Pressure 5 mmHg  Left Ventricular Apex Extended EDP Pressure 15 mmHg  QP/QS 1  TPVR Index 4.66 HRUI  TSVR Index 37.33 HRUI  PVR SVR Ratio 0.07  TPVR/TSVR Ratio 0.13    STS Risk Calculator  Procedure: Isolated AVR CALCULATE  Risk of Mortality:  1.224%   Renal Failure:  0.413%   Permanent Stroke:  1.228%   Prolonged  Ventilation:  3.751%   DSW Infection:  0.055%  Reoperation:  3.042%   Morbidity or Mortality:  7.046%   Short Length of Stay:  48.037%   Long Length of Stay:  2.636%      Impression:  Patient has stage D severe symptomatic aortic stenosis.  She describes recent onset of symptoms of progressive fatigue and exercise intolerance with mild exertional shortness of breath occurring only with more strenuous activity.  I have personally reviewed the patient's recent transthoracic echocardiogram and diagnostic cardiac catheterization.  Echocardiogram confirms the presence of severe aortic stenosis with normal left ventricular function.  The aortic valve is trileaflet.  There is severe thickening, calcification, and restricted leaflet mobility involving all 3 leaflets of the valve.  Peak velocity across the aortic valve measured 4.6 m/s corresponding to mean transvalvular gradient estimated greater than 50 mmHg.  Diagnostic cardiac catheterization was notable for the absence of significant coronary artery disease, confirmed the presence of severe aortic stenosis, and revealed normal right-sided pressures.  I agree the patient needs aortic valve replacement.  Risks associated with conventional surgery should be relatively low.  The patient might be a reasonable candidate for minimally invasive approach for surgery.   Plan:  The patient and her husband were counseled at length regarding treatment alternatives for management of severe aortic stenosis including continued medical therapy versus proceeding with aortic valve replacement in the near future.  The natural history of aortic stenosis was reviewed, as was long term prognosis with medical therapy alone.  Surgical options were discussed at length including conventional surgical aortic valve replacement through either a full median sternotomy or using minimally invasive techniques.  Other alternatives including rapid-deployment bioprosthetic tissue valve  replacement, transcatheter aortic valve replacement, patch enlargement of the aortic root, and stentless porcine aortic root replacement were discussed.  Discussion was held comparing the relative risks of mechanical valve replacement with need for lifelong anticoagulation versus use of a bioprosthetic tissue valve and the associated potential for late structural valve deterioration and failure.  This discussion was placed in the context of the patient's particular circumstances, and as a result the patient specifically requests that their valve be replaced using a bioprosthetic tissue valve.  Alternative surgical approaches have been discussed including a comparison between conventional sternotomy and minimally-invasive techniques.  The relative risks and benefits of each have been reviewed as they pertain to the patient's specific circumstances, and all of their questions have been addressed.    The patient hopes to proceed with surgery sometime in March.  We tentatively plan to proceed on Friday, March 20, 2018.  The patient will undergo CT angiography to further evaluate the size and functional anatomy of the aortic root as well as the feasibility of peripheral arterial cannulation for surgery.  She will return to our office for follow-up prior to surgery on March 16, 2018.   I spent in excess of 90 minutes during the conduct of this office consultation and >50% of this time involved direct face-to-face encounter with the patient for counseling and/or coordination of their care.  Valentina Gu. Roxy Manns, MD 02/23/2018 1:11 PM

## 2018-03-06 ENCOUNTER — Encounter (HOSPITAL_COMMUNITY): Payer: Self-pay

## 2018-03-06 ENCOUNTER — Ambulatory Visit (HOSPITAL_COMMUNITY)
Admission: RE | Admit: 2018-03-06 | Discharge: 2018-03-06 | Disposition: A | Discharge status: Home / Self Care | Payer: Medicare HMO | Source: Ambulatory Visit | Attending: Thoracic Surgery (Cardiothoracic Vascular Surgery) | Admitting: Thoracic Surgery (Cardiothoracic Vascular Surgery)

## 2018-03-06 DIAGNOSIS — I350 Nonrheumatic aortic (valve) stenosis: Secondary | ICD-10-CM

## 2018-03-06 DIAGNOSIS — I700 Atherosclerosis of aorta: Secondary | ICD-10-CM | POA: Diagnosis not present

## 2018-03-06 MED ORDER — IOPAMIDOL (ISOVUE-370) INJECTION 76%
INTRAVENOUS | Status: AC
  Administered 2018-03-06: 100 mL
  Filled 2018-03-06: qty 100

## 2018-03-16 ENCOUNTER — Other Ambulatory Visit: Payer: Self-pay | Admitting: *Deleted

## 2018-03-16 ENCOUNTER — Encounter: Payer: Self-pay | Admitting: Thoracic Surgery (Cardiothoracic Vascular Surgery)

## 2018-03-16 ENCOUNTER — Ambulatory Visit: Payer: Medicare HMO | Admitting: Thoracic Surgery (Cardiothoracic Vascular Surgery)

## 2018-03-16 ENCOUNTER — Other Ambulatory Visit: Payer: Self-pay

## 2018-03-16 VITALS — BP 154/72 | HR 74 | Resp 18 | Ht 63.5 in | Wt 147.2 lb

## 2018-03-16 DIAGNOSIS — I350 Nonrheumatic aortic (valve) stenosis: Secondary | ICD-10-CM

## 2018-03-16 NOTE — H&P (Signed)
[address]       [place] [address]             [phone]          CARDIOTHORACIC SURGERY HISTORY AND PHYSICAL EXAM  Referring Provider is Josue Hector, MD PCP is Gaynelle Arabian, MD      Chief Complaint  Patient presents with  . Aortic Stenosis    Surgical eval, Cardiac Cath 02/16/18, ECHO 01/01/18    HPI:  Patient is a 77 year old female with history of aortic stenosis, hypertension, hypercholesterolemia, asthmatic bronchitis, and degenerative joint disease who has been referred for surgical consultation to discuss treatment options for management of severe symptomatic aortic stenosis.  The patient has been physically active and healthy most of her adult life.  She was noted to have a heart murmur on physical exam and diagnosed with aortic stenosis several years ago.  She has been followed carefully for the past several years by Dr. Johnsie Cancel.  Serial echocardiograms demonstrated progression in disease, and recently the patient has begun to experience increased fatigue with decreased exercise tolerance and mild exertional shortness of breath.  Follow-up echocardiogram performed January 01, 2018 revealed severe aortic stenosis.  Peak velocity across the aortic valve measured 4.6 m/s corresponding to mean transvalvular gradient estimated 52 mmHg.  The DVI was quite low at 0.19.  Left ventricular systolic function remain normal at 60%.  The patient subsequently underwent left and right heart catheterization on February 16, 2018.  She was found to have normal coronary artery anatomy with no significant coronary artery disease.  Mean transvalvular gradient across the aortic valve was measured 38 mmHg with aortic valve area calculated 0.72 cm.  Pulmonary artery pressures were normal.  The patient was referred for elective surgical consultation.  Patient is married and lives locally in [place] with her husband.  She remains physically active.  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  She reports no specific physical limitations.  She has mild unsteady gait related to previous right hip replacement.  She otherwise walks without difficulty.  She complains of progression of exertional fatigue.  She states that she gets mildly short of breath with more strenuous exertion.  She otherwise denies shortness of breath.  She has not had chest pain or chest tightness either with activity or at rest.  She denies any history of PND, orthopnea, lower extremity edema or syncope.  She states that she occasionally gets dizzy transiently when she stands up from a sitting position.    Past Medical History:  Diagnosis Date  . Adenomatous colon polyp   . Allergy   . Aortic stenosis   . Arthritis   . Bronchitis, asthmatic   . Diverticulosis   . DJD (degenerative joint disease)   . Hypercholesterolemia   . Hypertension   . Hypothyroidism   . Osteopenia   . Vitamin D deficiency     Past Surgical History:  Procedure Laterality Date  . BREAST SURGERY Right    x 2  . COLONOSCOPY W/ POLYPECTOMY    . FINGER SURGERY Right    index finger  . RIGHT/LEFT HEART CATH AND CORONARY ANGIOGRAPHY N/A 02/16/2018   Procedure: RIGHT/LEFT HEART CATH AND CORONARY ANGIOGRAPHY;  Surgeon: Martinique, Peter M, MD;  Location: [facility];  Service: Cardiovascular;  Laterality: N/A;  . TONSILLECTOMY    . TOTAL HIP ARTHROPLASTY  Right 09/06/2013   Procedure: TOTAL HIP ARTHROPLASTY;  Surgeon: Kerin Salen, MD;  Location: [facility];  Service: Orthopedics;  Laterality: Right;    Family History  Problem Relation Age of Onset  . Ovarian cancer Sister   . Colon polyps Brother     Social History Social History   Tobacco Use  . Smoking status: Never Smoker  . Smokeless tobacco: Never Used  Substance Use Topics  . Alcohol use: Yes    Alcohol/week: 1.2 oz    Types: 2 Glasses of wine per week    Comment:  occasional  . Drug use: No    Prior to Admission medications   Medication Sig Start Date End Date Taking? Authorizing Provider  amoxicillin (AMOXIL) 500 MG capsule Take 2,000 mg by mouth as directed. Prior to dental procedures    Yes [provider]  Calcium Carbonate-Vitamin D (CALCIUM 500 + D PO) Take 2 tablets by mouth daily.   Yes [provider]  Cholecalciferol (VITAMIN D) 2000 units CAPS Take 2,000 Units by mouth daily.   Yes [provider]  FIBER SELECT GUMMIES CHEW Chew 1 tablet by mouth daily.    Yes [provider]  hydrochlorothiazide (HYDRODIURIL) 25 MG tablet Take 25 mg by mouth daily.   Yes [provider]  ibuprofen (ADVIL,MOTRIN) 200 MG tablet Take 200-400 mg by mouth daily as needed for headache or moderate pain.    Yes [provider]  levothyroxine (SYNTHROID, LEVOTHROID) 100 MCG tablet Take 100 mcg by mouth daily before breakfast.   Yes [provider]  aspirin EC 81 MG tablet Take 81 mg by mouth daily.    [provider]    Allergies  Allergen Reactions  . Fenofibrate Other (See Comments)    LEG ACHES, 09/2012, SIDE EFFECTS  . Keflex [Cephalexin] Other (See Comments)    LEG ACHES, 09/2012, SIDE EFFECTS  . Livalo [Pitavastatin] Other (See Comments)    LEG ACHES  . Lovastatin Other (See Comments)    LEG ACHES, SIDE EFFECTS  . Pravastatin Other (See Comments)    LEG ACHES, SIDE EFFECTS    Review of Systems:              General:                      normal appetite, decreased energy, no weight gain, no weight loss, no fever             Cardiac:                       no chest pain with exertion, no chest pain at rest, +SOB with strenuous exertion, no resting SOB, no PND, no orthopnea, no palpitations, no arrhythmia, no atrial fibrillation, no LE edema, + dizzy spells, no syncope             Respiratory:                 no shortness of breath, no home oxygen, no productive cough, no dry  cough, no bronchitis, no wheezing, no hemoptysis, no asthma, no pain with inspiration or cough, no sleep apnea, no CPAP at night             GI:                               no difficulty swallowing, no reflux, no  frequent heartburn, no hiatal hernia, no abdominal pain, occasional constipation, no diarrhea, no hematochezia, no hematemesis, no melena             GU:                              no dysuria,  + frequency, no urinary tract infection, no hematuria, no kidney stones, no kidney disease             Vascular:                     no pain suggestive of claudication, no pain in feet, no leg cramps, + varicose veins, no DVT, no non-healing foot ulcer             Neuro:                         no stroke, no TIA's, no seizures, no headaches, no temporary blindness one eye,  no slurred speech, no peripheral neuropathy, no chronic pain, very mild instability of gait, no memory/cognitive dysfunction             Musculoskeletal:         + arthritis, no joint swelling, no myalgias, minor difficulty walking, normal mobility              Skin:                            no rash, no itching, no skin infections, no pressure sores or ulcerations             Psych:                         no anxiety, no depression, no nervousness, no unusual recent stress             Eyes:                           no blurry vision, no floaters, no recent vision changes, + wears glasses or contacts             ENT:                            no hearing loss, no loose or painful teeth, no dentures, last saw dentist 12/17/2017             Hematologic:               + easy bruising, no abnormal bleeding, no clotting disorder, no frequent epistaxis             Endocrine:                   no diabetes, does not check CBG's at home                                                       Physical Exam:              BP (!) 150/77   Pulse 75   Resp 20   Ht  5' 3.5" (1.613 m)   Wt 148 lb (67.1 kg)   SpO2 97% Comment: RA  BMI  25.81 kg/m              General:                        well-appearing             HEENT:                       Unremarkable              Neck:                           no JVD, no bruits, no adenopathy              Chest:                          clear to auscultation, symmetrical breath sounds, no wheezes, no rhonchi              CV:                              RRR, grade III/VI crescendo/decrescendo murmur heard best at RSB,  no diastolic murmur             Abdomen:                    soft, non-tender, no masses              Extremities:                 warm, well-perfused, pulses diminished but palpable, no LE edema             Rectal/GU                   Deferred             Neuro:                         Grossly non-focal and symmetrical throughout             Skin:                            Clean and dry, no rashes, no breakdown   Diagnostic Tests:  Transthoracic Echocardiography  Patient: Marquisa, Salih MR #: [id number] Study Date: 01/01/2018 Gender: F Age: 54 Height: 162.6 cm Weight: 69.4 kg BSA: 1.79 m^2 Pt. Status: Room:  [facility] [address] [place] [address] ATTENDING Ena Dawley, M.D. PERFORMING [facility], Outpatient SONOGRAPHER [facility], RDCS  cc:  ------------------------------------------------------------------- LV EF: 55% - 60%  ------------------------------------------------------------------- Indications: Aortic Stenosis (I35.0).  ------------------------------------------------------------------- History: PMH: Aortic valve disease. Risk factors: Hypertension. Dyslipidemia.  ------------------------------------------------------------------- Study Conclusions  - Left ventricle: The cavity size was normal. Systolic function was normal. The estimated ejection fraction was in the range of 55% to 60%. Wall motion was normal; there  were no regional wall motion abnormalities. Doppler parameters are consistent with abnormal left ventricular relaxation (grade 1 diastolic dysfunction). Doppler parameters are consistent with elevated ventricular end-diastolic filling pressure. - Aortic valve: Trileaflet; severely thickened, severely calcified leaflets. Valve mobility was restricted.  There was critical stenosis. Mean gradient (S): 52 mm Hg. Peak gradient (S): 86 mm Hg. Valve area (VTI): 0.71 cm^2. Valve area (Vmax): 0.71 cm^2. Valve area (Vmean): 0.62 cm^2. - Mitral valve: There was no regurgitation. - Right ventricle: The cavity size was normal. Wall thickness was normal. Systolic function was normal. - Tricuspid valve: There was mild regurgitation. - Pulmonary arteries: Systolic pressure was within the normal range. - Inferior vena cava: The vessel was normal in size. - Pericardium, extracardiac: There was no pericardial effusion.  Impressions:  - Critical aortic stenosis, preserved LVEF.  ------------------------------------------------------------------- Study data: Comparison was made to the study of 12/31/2016. Study status: Routine. Procedure: Transthoracic echocardiography. Image quality was adequate. Transthoracic echocardiography. M-mode, complete 2D, 3D, spectral Doppler, and color Doppler. Birthdate: Patient birthdate: 1941/11/01. Age: Patient is 77 yr old. Sex: Gender: female. BMI: 26.2 kg/m^2. Blood pressure: 162/94 Patient status: Outpatient. Study date: Study date: 01/01/2018. Study time: 09:41 AM. Location: [facility]  -------------------------------------------------------------------  ------------------------------------------------------------------- Left ventricle: The cavity size was normal. Systolic function was normal. The estimated ejection fraction was in the range of 55% to 60%. Wall motion was normal; there were no  regional wall motion abnormalities. Doppler parameters are consistent with abnormal left ventricular relaxation (grade 1 diastolic dysfunction). Doppler parameters are consistent with elevated ventricular end-diastolic filling pressure.  ------------------------------------------------------------------- Aortic valve: Trileaflet; severely thickened, severely calcified leaflets. Valve mobility was restricted. Doppler: There was critical stenosis. There was no regurgitation. VTI ratio of LVOT to aortic valve: 0.19. Valve area (VTI): 0.71 cm^2. Indexed valve area (VTI): 0.4 cm^2/m^2. Peak velocity ratio of LVOT to aortic valve: 0.19. Valve area (Vmax): 0.71 cm^2. Indexed valve area (Vmax): 0.4 cm^2/m^2. Mean velocity ratio of LVOT to aortic valve: 0.16. Valve area (Vmean): 0.62 cm^2. Indexed valve area (Vmean): 0.35 cm^2/m^2. Mean gradient (S): 52 mm Hg. Peak gradient (S): 86 mm Hg.  ------------------------------------------------------------------- Aorta: Aortic root: The aortic root was normal in size.  ------------------------------------------------------------------- Mitral valve: Structurally normal valve. Mobility was not restricted. Doppler: Transvalvular velocity was within the normal range. There was no evidence for stenosis. There was no regurgitation. Peak gradient (D): 3 mm Hg.  ------------------------------------------------------------------- Left atrium: The atrium was normal in size.  ------------------------------------------------------------------- Right ventricle: The cavity size was normal. Wall thickness was normal. Systolic function was normal.  ------------------------------------------------------------------- Pulmonic valve: Structurally normal valve. Cusp separation was normal. Doppler: Transvalvular velocity was within the normal range. There was no evidence for stenosis. There was  no regurgitation.  ------------------------------------------------------------------- Tricuspid valve: Structurally normal valve. Doppler: Transvalvular velocity was within the normal range. There was mild regurgitation.  ------------------------------------------------------------------- Pulmonary artery: The main pulmonary artery was normal-sized. Systolic pressure was within the normal range.  ------------------------------------------------------------------- Right atrium: The atrium was normal in size.  ------------------------------------------------------------------- Pericardium: There was no pericardial effusion.  ------------------------------------------------------------------- Systemic veins: Inferior vena cava: The vessel was normal in size.  ------------------------------------------------------------------- Measurements  Left ventricle Value Reference LV ID, ED, PLAX chordal (L) 39.8 mm 43 - 52 LV ID, ES, PLAX chordal 25.6 mm 23 - 38 LV fx shortening, PLAX chordal 36 % >=29 LV PW thickness, ED 12.2 mm ---------- IVS/LV PW ratio, ED 0.82 <=1.3 Stroke volume, 2D 91 ml ---------- Stroke volume/bsa, 2D 51 ml/m^2 ---------- LV e&', lateral 5.55 cm/s ---------- LV E/e&', lateral 15.48 ---------- LV e&', medial 7.94 cm/s ---------- LV E/e&', medial 10.82 ---------- LV e&', average 6.75 cm/s ---------- LV E/e&', average 12.74 ----------  Ventricular septum Value Reference IVS thickness, ED  9.96 mm ----------  LVOT Value Reference LVOT ID, S 22 mm ---------- LVOT area 3.8 cm^2 ---------- LVOT peak velocity, S 87.2 cm/s ---------- LVOT mean velocity, S 55 cm/s ---------- LVOT VTI, S 24 cm ----------  Aortic valve Value Reference Aortic valve peak velocity, S 464 cm/s ---------- Aortic valve mean velocity, S 339 cm/s ---------- Aortic valve VTI, S 129 cm ---------- Aortic mean gradient, S 52 mm Hg ---------- Aortic peak gradient, S 86 mm Hg ---------- VTI ratio, LVOT/AV 0.19 ---------- Aortic valve area, VTI 0.71 cm^2 ---------- Aortic valve area/bsa, VTI 0.4 cm^2/m^2 ---------- Velocity ratio, peak, LVOT/AV 0.19 ---------- Aortic valve area, peak velocity 0.71 cm^2 ---------- Aortic valve area/bsa, peak 0.4 cm^2/m^2 ---------- velocity Velocity ratio, mean, LVOT/AV 0.16 ---------- Aortic valve area, mean velocity 0.62 cm^2 ---------- Aortic valve area/bsa, mean 0.35 cm^2/m^2 ---------- velocity  Aorta Value Reference Aortic root ID, ED 28 mm ---------- Ascending aorta ID, A-P, S 30 mm ----------  Left atrium Value Reference LA ID, A-P, ES 36 mm ---------- LA ID/bsa, A-P 2.02 cm/m^2 <=2.2 LA volume,  S 51.3 ml ---------- LA volume/bsa, S 28.7 ml/m^2 ---------- LA volume, ES, 1-p A4C 38.8 ml ---------- LA volume/bsa, ES, 1-p A4C 21.7 ml/m^2 ---------- LA volume, ES, 1-p A2C 56.7 ml ---------- LA volume/bsa, ES, 1-p A2C 31.8 ml/m^2 ----------  Mitral valve Value Reference Mitral E-wave peak velocity 85.9 cm/s ---------- Mitral A-wave peak velocity 114 cm/s ---------- Mitral deceleration time (H) 299 ms 150 - 230 Mitral peak gradient, D 3 mm Hg ---------- Mitral E/A ratio, peak 0.8 ----------  Tricuspid valve Value Reference Tricuspid regurg peak velocity 236 cm/s ---------- Tricuspid peak RV-RA gradient 22 mm Hg ----------  Right atrium Value Reference RA ID, S-I, ES, A4C (H) 55.2 mm 34 - 49 RA area, ES, A4C 18.9 cm^2 8.3 - 19.5 RA volume, ES, A/L 53 ml ---------- RA volume/bsa, ES, A/L 29.7 ml/m^2 ----------  Right ventricle Value Reference TAPSE 27.3 mm ---------- RV s&', lateral, S 13.5 cm/s ----------  Legend: (L) and (H) mark values outside specified reference range.  ------------------------------------------------------------------- Prepared and Electronically Authenticated by  Ena Dawley, M.D. 2019-01-03T11:08:29   RIGHT/LEFT HEART CATH AND CORONARY ANGIOGRAPHY  Conclusion     The left ventricular systolic function is  normal.  LV end diastolic pressure is normal.  The left ventricular ejection fraction is greater than 65% by visual estimate.  There is severe aortic valve stenosis.  1. Normal coronary anatomy 2. Severe aortic stenosis. Mean gradient 30 mm Hg. Valve area 0.72 cm squared, index 0.42 3. Normal LV filling pressures. 4. Normal right heart pressures 5. Normal cardiac output.  Plan: referral for AVR.   Indications   Aortic stenosis, severe [I35.0 (ICD-10-CM)]  Procedural Details/Technique   Technical Details Indication: 77 yo WF with severe aortic stenosis  Procedural Details: The right wrist and left brachial area were prepped, draped, and anesthetized with 1% lidocaine. Using the modified Seldinger technique a 6 Fr slender sheath was placed in the right radial artery and a 5 French sheath was placed in the left brachial vein. A Swan-Ganz catheter was used for the right heart catheterization. Standard protocol was followed for recording of right heart pressures and sampling of oxygen saturations. Fick cardiac output was calculated. Standard Judkins catheters were used for selective coronary angiography and left ventriculography. There were no immediate procedural complications. The patient was transferred to the post catheterization recovery area for further monitoring.  Contrast: 60 cc   Estimated blood loss <50 mL.  During this procedure  the patient was administered the following to achieve and maintain moderate conscious sedation: Versed 1 mg, Fentanyl 25 mcg, while the patient's heart rate, blood pressure, and oxygen saturation were continuously monitored. The period of conscious sedation was 36 minutes, of which I was present face-to-face 100% of this time.  Complications   Complications documented before study signed (02/16/2018 11:45 AM EST)    No complications were associated with this study.  Documented by Martinique, Peter M, MD - 02/16/2018 11:37 AM EST    Coronary  Findings   Diagnostic  Dominance: Right  Left Main  Vessel was injected. Vessel is normal in caliber. Vessel is angiographically normal.  Left Anterior Descending  Vessel was injected. Vessel is normal in caliber. Vessel is angiographically normal.  Left Circumflex  Vessel was injected. Vessel is normal in caliber. Vessel is angiographically normal.  Right Coronary Artery  Vessel was injected. Vessel is normal in caliber. Vessel is angiographically normal.  Intervention   No interventions have been documented.  Wall Motion                  Left Heart   Left Ventricle The left ventricular size is normal. The left ventricular systolic function is normal. LV end diastolic pressure is normal. The left ventricular ejection fraction is greater than 65% by visual estimate. No regional wall motion abnormalities.  Aortic Valve There is severe aortic valve stenosis. The aortic valve is calcified. There is restricted aortic valve motion.  Coronary Diagrams   Diagnostic Diagram       Implants        No implant documentation for this case.  MERGE Images   Show images for CARDIAC CATHETERIZATION   Link to Procedure Log   Procedure Log    Hemo Data    Most Recent Value  Fick Cardiac Output 4.79 L/min  Fick Cardiac Output Index 2.78 (L/min)/BSA  Aortic Mean Gradient 30 mmHg  Aortic Peak Gradient 29 mmHg  Aortic Valve Area 0.72  Aortic Value Area Index 0.42 cm2/BSA  RA A Wave 4 mmHg  RA V Wave 3 mmHg  RA Mean 2 mmHg  RV Systolic Pressure 28 mmHg  RV Diastolic Pressure 0 mmHg  RV EDP 4 mmHg  PA Systolic Pressure 24 mmHg  PA Diastolic Pressure 5 mmHg  PA Mean 13 mmHg  PW A Wave 10 mmHg  PW V Wave 9 mmHg  PW Mean 6 mmHg  AO Systolic Pressure 660 mmHg  AO Diastolic Pressure 67 mmHg  AO Mean 630 mmHg  LV Systolic Pressure 160 mmHg  LV Diastolic Pressure -1 mmHg  LV EDP 18 mmHg  Arterial Occlusion Pressure Extended Systolic Pressure 109 mmHg  Arterial  Occlusion Pressure Extended Diastolic Pressure 68 mmHg  Arterial Occlusion Pressure Extended Mean Pressure 114 mmHg  Left Ventricular Apex Extended Systolic Pressure 323 mmHg  Left Ventricular Apex Extended Diastolic Pressure 5 mmHg  Left Ventricular Apex Extended EDP Pressure 15 mmHg  QP/QS 1  TPVR Index 4.66 HRUI  TSVR Index 37.33 HRUI  PVR SVR Ratio 0.07  TPVR/TSVR Ratio 0.13    STS Risk Calculator  Procedure: Isolated AVR CALCULATE  Risk of Mortality:  1.224%   Renal Failure:  0.413%   Permanent Stroke:  1.228%   Prolonged Ventilation:  3.751%   DSW Infection:  0.055%   Reoperation:  3.042%   Morbidity or Mortality:  7.046%   Short Length of Stay:  48.037%   Long Length of Stay:  2.636%  Cardiac TAVR CT  TECHNIQUE: The patient was scanned on a [employer]. A 120 kV retrospective scan was triggered in the descending thoracic aorta at 111 HU's. Gantry rotation speed was 250 msecs and collimation was .6 mm. No beta blockade or nitro were given. The 3D data set was reconstructed in 5% intervals of the R-R cycle. Systolic and diastolic phases were analyzed on a dedicated work station using MPR, MIP and VRT modes. The patient received 80 cc of contrast.  FINDINGS: Aortic Valve: Trileaflet aortic valve with moderately thickened and calcified leaflets with severely restricted leaflets opening. Minimal calcifications are extending into the LVOT.  Aorta: Normal size, no dissection, mild calcifications and atherosclerotic plaque.  Sinotubular Junction: 27 x 26 mm  Ascending Thoracic Aorta: 29 x 29 mm  Aortic Arch: 27 x 26 mm  Descending Thoracic Aorta: 24 x 23 mm  Sinus of Valsalva Measurements:  Non-coronary: 29 mm  Right -coronary: 28 mm  Left -coronary: 29 mm  Coronary Artery Height above Annulus:  Left Main: 12 mm  Right Coronary: 15 mm  Virtual Basal Annulus Measurements:  Maximum/Minimum Diameter: 26.2 x 24.1  mm  Mean Diameter: 24.3 mm  Perimeter: 78.1 mm  Area: 465 mm2  IMPRESSION: 1. Trileaflet aortic valve with moderately thickened and calcified leaflets with severely restricted leaflets opening. Minimal calcifications are extending into the LVOT. Annular measurements as stated above.  2. Thoracic aorta has normal size, no dissection, mild calcifications and atherosclerotic plaque.  3. No thrombus in the left atrial appendage.  Electronically Signed: By: Ena Dawley On: 03/09/2018 08:05   CT ANGIOGRAPHY CHEST, ABDOMEN AND PELVIS  TECHNIQUE: Multidetector CT imaging through the chest, abdomen and pelvis was performed using the standard protocol during bolus administration of intravenous contrast. Multiplanar reconstructed images and MIPs were obtained and reviewed to evaluate the vascular anatomy.  CONTRAST: 160mL ISOVUE-370 IOPAMIDOL (ISOVUE-370) INJECTION 76%  COMPARISON: No priors.  FINDINGS: CTA CHEST FINDINGS  Cardiovascular: Heart size is mildly enlarged. There is no significant pericardial fluid, thickening or pericardial calcification. There is aortic atherosclerosis, as well as atherosclerosis of the great vessels of the mediastinum and the coronary arteries, including calcified atherosclerotic plaque in the left anterior descending and right coronary arteries. Severe thickening calcification of the aortic valve.  Mediastinum/Lymph Nodes: No pathologically enlarged mediastinal or hilar lymph nodes. Esophagus is unremarkable in appearance. No axillary lymphadenopathy.  Lungs/Pleura: No suspicious pulmonary nodules. No acute consolidative airspace disease. No pleural effusions.  Musculoskeletal/Soft Tissues: There are no aggressive appearing lytic or blastic lesions noted in the visualized portions of the skeleton.  CTA ABDOMEN AND PELVIS FINDINGS  Hepatobiliary: Subcentimeter low-attenuation lesion in segment 2 of the liver  is too small to characterize, but statistically likely tiny cysts. There also small hypervascular lesions in segment 8 and segment 7, largest of which measures 9 mm in segment 7 (axial image 81 of series 14), also incompletely characterized on this single phase examination, but favored to represent benign lesions such as a flash fill cavernous hemangiomas or benign perfusion anomalies. No other larger more suspicious appearing hepatic lesions are noted in the liver. No intra or extrahepatic biliary ductal dilatation. Gallbladder is normal in appearance.  Pancreas: No pancreatic mass. No pancreatic ductal dilatation. No pancreatic or peripancreatic fluid or inflammatory changes.  Spleen: Unremarkable.  Adrenals/Urinary Tract: 12 mm simple cyst in the interpolar region of the left kidney. Right kidney and bilateral adrenal glands are normal in appearance. No hydroureteronephrosis. Urinary bladder is partially obscured by  beam hardening artifact from the patient's right hip arthroplasty, but is generally unremarkable in appearance.  Stomach/Bowel: Normal appearance of the stomach. No pathologic dilatation of small bowel or colon. Several colonic diverticulae are noted, without surrounding inflammatory changes to suggest an acute diverticulitis at this time. Normal appendix.  Vascular/Lymphatic: Aortic atherosclerosis, with vascular findings and measurements pertinent to potential TAVR procedure, as detailed below. No aneurysm or dissection noted in the abdominal or pelvic vasculature. Celiac axis, superior mesenteric artery and inferior mesenteric artery are all widely patent without hemodynamically significant stenosis. Single left and 2 right-sided renal arteries are widely patent without hemodynamically significant stenosis. Multiple dilated left ovarian veins and dilated left gonadal vein, suggestive of potential pelvic congestion syndrome. No lymphadenopathy noted in the  abdomen or pelvis.  Reproductive: Uterus and ovaries are unremarkable in appearance.  Other: No significant volume of ascites. No pneumoperitoneum.  Musculoskeletal: Status post right hip arthroplasty. There are no aggressive appearing lytic or blastic lesions noted in the visualized portions of the skeleton.  VASCULAR MEASUREMENTS PERTINENT TO TAVR:  AORTA:  Minimal Aortic Diameter-14 x 15 mm  Severity of Aortic Calcification-mild  RIGHT PELVIS:  Right Common Iliac Artery -  Minimal Diameter-8.6 x 8.9 mm  Tortuosity-mild  Calcification-mild  Right External Iliac Artery -  Minimal Diameter-6.4 x 6.4 mm  Tortuosity-mild-to-moderate  Calcification-none  Right Common Femoral Artery -  Minimal Diameter-8.7 x 7.5 mm  Tortuosity-mild  Calcification-none  LEFT PELVIS:  Left Common Iliac Artery -  Minimal Diameter-8.0 x 7.9 mm  Tortuosity-mild  Calcification-mild  Left External Iliac Artery -  Minimal Diameter-6.3 x 6.0 mm  Tortuosity-mild-to-moderate  Calcification-none  Left Common Femoral Artery -  Minimal Diameter-7.3 x 6.7 mm  Tortuosity-mild  Calcification-none  Review of the MIP images confirms the above findings.  IMPRESSION: 1. Vascular findings and measurements pertinent to potential TAVR procedure, as detailed above. 2. Severe thickening and calcification of the aortic valve, compatible with the reported clinical history of severe aortic stenosis. 3. Aortic atherosclerosis, in addition to 2 vessel coronary artery disease. Assessment for potential risk factor modification, dietary therapy or pharmacologic therapy may be warranted, if clinically indicated. 4. 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 5. Additional incidental findings, as above.  Aortic Atherosclerosis (ICD10-I70.0).   Electronically Signed By: Vinnie Langton M.D. On: 03/09/2018  13:21   Impression:  Patient has stage D severe symptomatic aortic stenosis.She describes recent onset of symptoms of progressive fatigue and exercise intolerance with mild exertional shortness of breath occurring only with more strenuous activity. I have personally reviewed the patient's recent transthoracic echocardiogram, diagnostic cardiac catheterization, and CT angiograms. Echocardiogram confirms the presence of severe aortic stenosis with normal left ventricular function. The aortic valve is trileaflet. There is severe thickening, calcification, and restricted leaflet mobility involving all 3 leaflets of the valve. Peak velocity across the aortic valve measured 4.6 m/s corresponding to mean transvalvular gradient estimated greater than 50 mmHg. Diagnostic cardiac catheterization was notable for the absence of significant coronary artery disease, confirmed the presence of severe aortic stenosis, and revealed normal right-sided pressures. I agree the patient needs aortic valve replacement. Risks associated with conventional surgery should be relatively low. CTA of the heart and chest demonstrates somewhat leftward displacement of the aortic valve and proximal aortic root within the chest which frequently can be problematic with use of the right mini thoracotomy approach for aortic valve replacement.    Plan:  The patient and her husband were again counseled regarding treatment alternatives  for management of severe symptomatic aortic stenosis.  We discussed the news over this past weekend regarding recent studies investigating the use of transcatheter aortic valve replacement in patients who are felt to be relatively low risk for conventional surgery.  Natural history of aortic stenosis was reviewed as were surgical options that are currently available.  We discussed the findings on the patient's CT angiogram and the possible impact related to use of the right mini thoracotomy  approach for surgery.  We plan to proceed with aortic valve replacement via conventional median sternotomy using a bioprosthetic tissue valve on Friday, March 20, 2018.  The patient understands and accepts all potential associated risks of surgery including but not limited to risk of death, stroke, myocardial infarction, congestive heart failure, respiratory failure, renal failure, pneumonia, bleeding requiring blood transfusion and or reexploration, arrhythmia, heart block or bradycardia requiring permanent pacemaker, aortic dissection or other major vascular complication, pleural effusions or other delayed complications related to continued congestive heart failure, and other late complications related to valve replacement including structural valve deterioration and failure, thrombosis, endocarditis, or paravalvular leak.  All questions answered.    Valentina Gu. Roxy Manns, MD 03/16/2018 10:50 AM

## 2018-03-16 NOTE — Patient Instructions (Signed)
Stop taking aspirin  Continue taking all other medications without change through the day before surgery.  Have nothing to eat or drink after midnight the night before surgery.  On the morning of surgery take only Synthroid with a sip of water.

## 2018-03-16 NOTE — Progress Notes (Signed)
[address]       [place] [address]             [phone]     CARDIOTHORACIC SURGERY OFFICE NOTE  Referring Provider is Josue Hector, MD PCP is Gaynelle Arabian, MD   HPI:  Patient returns to the office today for follow-up of severe symptomatic aortic stenosis.  She was originally seen in consultation on February 23, 2018 and she returns to the office today with tentative plans to proceed with aortic valve replacement later this week.  She reports no new problems or complaints.   Current Outpatient Medications  Medication Sig Dispense Refill  . amoxicillin (AMOXIL) 500 MG capsule Take 2,000 mg by mouth as directed. Prior to dental procedures     . aspirin EC 81 MG tablet Take 81 mg by mouth daily.    . Calcium Carbonate-Vitamin D (CALCIUM 500 + D PO) Take 2 tablets by mouth daily.    . Cholecalciferol (VITAMIN D) 2000 units CAPS Take 2,000 Units by mouth daily.    Marland Kitchen FIBER SELECT GUMMIES CHEW Chew 1 tablet by mouth daily.     . hydrochlorothiazide (HYDRODIURIL) 25 MG tablet Take 25 mg by mouth daily.    Marland Kitchen ibuprofen (ADVIL,MOTRIN) 200 MG tablet Take 200-400 mg by mouth daily as needed for headache or moderate pain.     Marland Kitchen levothyroxine (SYNTHROID, LEVOTHROID) 100 MCG tablet Take 100 mcg by mouth daily before breakfast.     No current facility-administered medications for this visit.       Physical Exam:   BP (!) 154/72 (BP Location: Left Arm, Patient Position: Sitting, Cuff Size: Normal)   Pulse 74   Resp 18   Ht 5' 3.5" (1.613 m)   Wt 147 lb 3.2 oz (66.8 kg)   SpO2 97% Comment: RA  BMI 25.67 kg/m   General:  Well-appearing  Chest:   Clear to auscultation  CV:   Regular rate and rhythm with systolic murmur  Incisions:  n/a  Abdomen:  Soft nontender  Extremities:  Warm and well perfused  Diagnostic Tests:  Cardiac TAVR CT  TECHNIQUE: The patient was scanned on a [employer]. A 120 kV retrospective scan was triggered in the  descending thoracic aorta at 111 HU's. Gantry rotation speed was 250 msecs and collimation was .6 mm. No beta blockade or nitro were given. The 3D data set was reconstructed in 5% intervals of the R-R cycle. Systolic and diastolic phases were analyzed on a dedicated work station using MPR, MIP and VRT modes. The patient received 80 cc of contrast.  FINDINGS: Aortic Valve: Trileaflet aortic valve with moderately thickened and calcified leaflets with severely restricted leaflets opening. Minimal calcifications are extending into the LVOT.  Aorta: Normal size, no dissection, mild calcifications and atherosclerotic plaque.  Sinotubular Junction: 27 x 26 mm  Ascending Thoracic Aorta: 29 x 29 mm  Aortic Arch: 27 x 26 mm  Descending Thoracic Aorta: 24 x 23 mm  Sinus of Valsalva Measurements:  Non-coronary: 29 mm  Right -coronary: 28 mm  Left -coronary: 29 mm  Coronary Artery Height above Annulus:  Left Main: 12 mm  Right Coronary: 15 mm  Virtual Basal Annulus Measurements:  Maximum/Minimum Diameter: 26.2 x 24.1 mm  Mean Diameter: 24.3 mm  Perimeter: 78.1 mm  Area: 465 mm2  IMPRESSION: 1. Trileaflet aortic valve with moderately thickened and calcified leaflets with severely restricted leaflets opening. Minimal calcifications are extending into the  LVOT. Annular measurements as stated above.  2. Thoracic aorta has normal size, no dissection, mild calcifications and atherosclerotic plaque.  3. No thrombus in the left atrial appendage.  Electronically Signed: By: Ena Dawley On: 03/09/2018 08:05   CT ANGIOGRAPHY CHEST, ABDOMEN AND PELVIS  TECHNIQUE: Multidetector CT imaging through the chest, abdomen and pelvis was performed using the standard protocol during bolus administration of intravenous contrast. Multiplanar reconstructed images and MIPs were obtained and reviewed to evaluate the vascular anatomy.  CONTRAST:  113mL  ISOVUE-370 IOPAMIDOL (ISOVUE-370) INJECTION 76%  COMPARISON:  No priors.  FINDINGS: CTA CHEST FINDINGS  Cardiovascular: Heart size is mildly enlarged. There is no significant pericardial fluid, thickening or pericardial calcification. There is aortic atherosclerosis, as well as atherosclerosis of the great vessels of the mediastinum and the coronary arteries, including calcified atherosclerotic plaque in the left anterior descending and right coronary arteries. Severe thickening calcification of the aortic valve.  Mediastinum/Lymph Nodes: No pathologically enlarged mediastinal or hilar lymph nodes. Esophagus is unremarkable in appearance. No axillary lymphadenopathy.  Lungs/Pleura: No suspicious pulmonary nodules. No acute consolidative airspace disease. No pleural effusions.  Musculoskeletal/Soft Tissues: There are no aggressive appearing lytic or blastic lesions noted in the visualized portions of the skeleton.  CTA ABDOMEN AND PELVIS FINDINGS  Hepatobiliary: Subcentimeter low-attenuation lesion in segment 2 of the liver is too small to characterize, but statistically likely tiny cysts. There also small hypervascular lesions in segment 8 and segment 7, largest of which measures 9 mm in segment 7 (axial image 81 of series 14), also incompletely characterized on this single phase examination, but favored to represent benign lesions such as a flash fill cavernous hemangiomas or benign perfusion anomalies. No other larger more suspicious appearing hepatic lesions are noted in the liver. No intra or extrahepatic biliary ductal dilatation. Gallbladder is normal in appearance.  Pancreas: No pancreatic mass. No pancreatic ductal dilatation. No pancreatic or peripancreatic fluid or inflammatory changes.  Spleen: Unremarkable.  Adrenals/Urinary Tract: 12 mm simple cyst in the interpolar region of the left kidney. Right kidney and bilateral adrenal glands are normal in  appearance. No hydroureteronephrosis. Urinary bladder is partially obscured by beam hardening artifact from the patient's right hip arthroplasty, but is generally unremarkable in appearance.  Stomach/Bowel: Normal appearance of the stomach. No pathologic dilatation of small bowel or colon. Several colonic diverticulae are noted, without surrounding inflammatory changes to suggest an acute diverticulitis at this time. Normal appendix.  Vascular/Lymphatic: Aortic atherosclerosis, with vascular findings and measurements pertinent to potential TAVR procedure, as detailed below. No aneurysm or dissection noted in the abdominal or pelvic vasculature. Celiac axis, superior mesenteric artery and inferior mesenteric artery are all widely patent without hemodynamically significant stenosis. Single left and 2 right-sided renal arteries are widely patent without hemodynamically significant stenosis. Multiple dilated left ovarian veins and dilated left gonadal vein, suggestive of potential pelvic congestion syndrome. No lymphadenopathy noted in the abdomen or pelvis.  Reproductive: Uterus and ovaries are unremarkable in appearance.  Other: No significant volume of ascites.  No pneumoperitoneum.  Musculoskeletal: Status post right hip arthroplasty. There are no aggressive appearing lytic or blastic lesions noted in the visualized portions of the skeleton.  VASCULAR MEASUREMENTS PERTINENT TO TAVR:  AORTA:  Minimal Aortic Diameter-14 x 15 mm  Severity of Aortic Calcification-mild  RIGHT PELVIS:  Right Common Iliac Artery -  Minimal Diameter-8.6 x 8.9 mm  Tortuosity-mild  Calcification-mild  Right External Iliac Artery -  Minimal Diameter-6.4 x 6.4 mm  Tortuosity-mild-to-moderate  Calcification-none  Right Common Femoral Artery -  Minimal Diameter-8.7 x 7.5 mm  Tortuosity-mild  Calcification-none  LEFT PELVIS:  Left Common Iliac Artery  -  Minimal Diameter-8.0 x 7.9 mm  Tortuosity-mild  Calcification-mild  Left External Iliac Artery -  Minimal Diameter-6.3 x 6.0 mm  Tortuosity-mild-to-moderate  Calcification-none  Left Common Femoral Artery -  Minimal Diameter-7.3 x 6.7 mm  Tortuosity-mild  Calcification-none  Review of the MIP images confirms the above findings.  IMPRESSION: 1. Vascular findings and measurements pertinent to potential TAVR procedure, as detailed above. 2. Severe thickening and calcification of the aortic valve, compatible with the reported clinical history of severe aortic stenosis. 3. Aortic atherosclerosis, in addition to 2 vessel coronary artery disease. Assessment for potential risk factor modification, dietary therapy or pharmacologic therapy may be warranted, if clinically indicated. 4. 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 5. Additional incidental findings, as above.  Aortic Atherosclerosis (ICD10-I70.0).   Electronically Signed   By: Vinnie Langton M.D.   On: 03/09/2018 13:21   Impression:  Patient has stage D severe symptomatic aortic stenosis.  She describes recent onset of symptoms of progressive fatigue and exercise intolerance with mild exertional shortness of breath occurring only with more strenuous activity.  I have personally reviewed the patient's recent transthoracic echocardiogram, diagnostic cardiac catheterization, and CT angiograms.  Echocardiogram confirms the presence of severe aortic stenosis with normal left ventricular function.  The aortic valve is trileaflet.  There is severe thickening, calcification, and restricted leaflet mobility involving all 3 leaflets of the valve.  Peak velocity across the aortic valve measured 4.6 m/s corresponding to mean transvalvular gradient estimated greater than 50 mmHg.  Diagnostic cardiac catheterization was notable for the absence of significant coronary artery  disease, confirmed the presence of severe aortic stenosis, and revealed normal right-sided pressures.  I agree the patient needs aortic valve replacement.  Risks associated with conventional surgery should be relatively low.  CTA of the heart and chest demonstrates somewhat leftward displacement of the aortic valve and proximal aortic root within the chest which frequently can be problematic with use of the right mini thoracotomy approach for aortic valve replacement.    Plan:  The patient and her husband were again counseled regarding treatment alternatives for management of severe symptomatic aortic stenosis.  We discussed the news over this past weekend regarding recent studies investigating the use of transcatheter aortic valve replacement in patients who are felt to be relatively low risk for conventional surgery.  Natural history of aortic stenosis was reviewed as were surgical options that are currently available.  We discussed the findings on the patient's CT angiogram and the possible impact related to use of the right mini thoracotomy approach for surgery.  We plan to proceed with aortic valve replacement via conventional median sternotomy using a bioprosthetic tissue valve on Friday, March 20, 2018.  The patient understands and accepts all potential associated risks of surgery including but not limited to risk of death, stroke, myocardial infarction, congestive heart failure, respiratory failure, renal failure, pneumonia, bleeding requiring blood transfusion and or reexploration, arrhythmia, heart block or bradycardia requiring permanent pacemaker, aortic dissection or other major vascular complication, pleural effusions or other delayed complications related to continued congestive heart failure, and other late complications related to valve replacement including structural valve deterioration and failure, thrombosis, endocarditis, or paravalvular leak.  All questions answered.   I spent in  excess of 15 minutes during the conduct of this office consultation and >50% of this time  involved direct face-to-face encounter with the patient for counseling and/or coordination of their care.    Valentina Gu. Roxy Manns, MD 03/16/2018 10:50 AM

## 2018-03-17 NOTE — Pre-Procedure Instructions (Signed)
Cheyenne Jones  03/17/2018      [facility] [place] [address] [place] ([place]) [place] [address] Phone: [phone] Fax: [phone]    Your procedure is scheduled on March 20, 2018.  Report to [facility] Admitting at 05:30 A.M.  Call this number if you have problems the morning of surgery:  [phone]   Remember:  Do not eat food or drink liquids after midnight.  Take these medicines the morning of surgery with A SIP OF WATER : Levothyroxine (Synthroid)  Follow your doctor's instructions regarding your Aspirin.  7 days prior to surgery STOP taking any Aspirin (unless otherwise instructed by your surgeon), Aleve, Naproxen, Ibuprofen, Motrin, Advil, Goody's, BC's, all herbal medications, fish oil, and all vitamins.   Do not wear jewelry, make-up or nail polish.  Do not wear lotions, powders, or perfumes, or deodorant.  Do not shave 48 hours prior to surgery.   Do not bring valuables to the hospital.  [facility] is not responsible for any belongings or valuables.  Contacts, dentures or bridgework may not be worn into surgery.  Leave your suitcase in the car.  After surgery it may be brought to your room.  For patients admitted to the hospital, discharge time will be determined by your treatment team.  Patients discharged the day of surgery will not be allowed to drive home.   Special instructions:   [facility]- Preparing For Surgery  Before surgery, you can play an important role. Because skin is not sterile, your skin needs to be as free of germs as possible. You can reduce the number of germs on your skin by washing with CHG (chlorahexidine gluconate) Soap before surgery.  CHG is an antiseptic cleaner which kills germs and bonds with the skin to continue killing germs even after washing.  Please do not use if you have an allergy to CHG or antibacterial soaps. If your skin becomes reddened/irritated stop  using the CHG.  Do not shave (including legs and underarms) for at least 48 hours prior to first CHG shower. It is OK to shave your face.  Please follow these instructions carefully.   1. Shower the NIGHT BEFORE SURGERY and the MORNING OF SURGERY with CHG.   2. If you chose to wash your hair, wash your hair first as usual with your normal shampoo.  3. After you shampoo, rinse your hair and body thoroughly to remove the shampoo.  4. Use CHG as you would any other liquid soap. You can apply CHG directly to the skin and wash gently with a scrungie or a clean washcloth.   5. Apply the CHG Soap to your body ONLY FROM THE NECK DOWN.  Do not use on open wounds or open sores. Avoid contact with your eyes, ears, mouth and genitals (private parts). Wash Face and genitals (private parts)  with your normal soap.  6. Wash thoroughly, paying special attention to the area where your surgery will be performed.  7. Thoroughly rinse your body with warm water from the neck down.  8. DO NOT shower/wash with your normal soap after using and rinsing off the CHG Soap.  9. Pat yourself dry with a CLEAN TOWEL.  10. Wear CLEAN PAJAMAS to bed the night before surgery, wear comfortable clothes the morning of surgery  11. Place CLEAN SHEETS on your bed the night of your first shower and DO NOT SLEEP WITH PETS.  Day of Surgery: Do not apply any deodorants/lotions. Please wear clean clothes to the hospital/surgery center.      Please read over the following fact sheets that you were given.

## 2018-03-18 ENCOUNTER — Ambulatory Visit (HOSPITAL_BASED_OUTPATIENT_CLINIC_OR_DEPARTMENT_OTHER)
Admission: RE | Admit: 2018-03-18 | Discharge: 2018-03-18 | Disposition: A | Discharge status: Home / Self Care | Payer: Medicare HMO | Source: Ambulatory Visit | Attending: Thoracic Surgery (Cardiothoracic Vascular Surgery) | Admitting: Thoracic Surgery (Cardiothoracic Vascular Surgery)

## 2018-03-18 ENCOUNTER — Other Ambulatory Visit: Payer: Self-pay

## 2018-03-18 ENCOUNTER — Encounter (HOSPITAL_COMMUNITY)
Admission: RE | Admit: 2018-03-18 | Discharge: 2018-03-18 | Disposition: A | Discharge status: Home / Self Care | Payer: Medicare HMO | Source: Ambulatory Visit | Attending: Thoracic Surgery (Cardiothoracic Vascular Surgery) | Admitting: Thoracic Surgery (Cardiothoracic Vascular Surgery)

## 2018-03-18 ENCOUNTER — Ambulatory Visit (HOSPITAL_COMMUNITY)
Admission: RE | Admit: 2018-03-18 | Discharge: 2018-03-18 | Disposition: A | Discharge status: Home / Self Care | Payer: Medicare HMO | Source: Ambulatory Visit | Attending: Thoracic Surgery (Cardiothoracic Vascular Surgery) | Admitting: Thoracic Surgery (Cardiothoracic Vascular Surgery)

## 2018-03-18 ENCOUNTER — Encounter (HOSPITAL_COMMUNITY): Payer: Self-pay

## 2018-03-18 DIAGNOSIS — Z96641 Presence of right artificial hip joint: Secondary | ICD-10-CM | POA: Diagnosis present

## 2018-03-18 DIAGNOSIS — E039 Hypothyroidism, unspecified: Secondary | ICD-10-CM | POA: Diagnosis not present

## 2018-03-18 DIAGNOSIS — Z79899 Other long term (current) drug therapy: Secondary | ICD-10-CM | POA: Diagnosis not present

## 2018-03-18 DIAGNOSIS — Z8371 Family history of colonic polyps: Secondary | ICD-10-CM | POA: Diagnosis not present

## 2018-03-18 DIAGNOSIS — I700 Atherosclerosis of aorta: Secondary | ICD-10-CM | POA: Diagnosis not present

## 2018-03-18 DIAGNOSIS — E8770 Fluid overload, unspecified: Secondary | ICD-10-CM | POA: Diagnosis not present

## 2018-03-18 DIAGNOSIS — J439 Emphysema, unspecified: Secondary | ICD-10-CM | POA: Diagnosis not present

## 2018-03-18 DIAGNOSIS — D6959 Other secondary thrombocytopenia: Secondary | ICD-10-CM | POA: Diagnosis not present

## 2018-03-18 DIAGNOSIS — E785 Hyperlipidemia, unspecified: Secondary | ICD-10-CM | POA: Diagnosis present

## 2018-03-18 DIAGNOSIS — I350 Nonrheumatic aortic (valve) stenosis: Secondary | ICD-10-CM | POA: Diagnosis present

## 2018-03-18 DIAGNOSIS — J9811 Atelectasis: Secondary | ICD-10-CM | POA: Diagnosis not present

## 2018-03-18 DIAGNOSIS — R011 Cardiac murmur, unspecified: Secondary | ICD-10-CM | POA: Diagnosis not present

## 2018-03-18 DIAGNOSIS — Z881 Allergy status to other antibiotic agents status: Secondary | ICD-10-CM | POA: Diagnosis not present

## 2018-03-18 DIAGNOSIS — Z01818 Encounter for other preprocedural examination: Secondary | ICD-10-CM

## 2018-03-18 DIAGNOSIS — I119 Hypertensive heart disease without heart failure: Secondary | ICD-10-CM | POA: Insufficient documentation

## 2018-03-18 DIAGNOSIS — Z01810 Encounter for preprocedural cardiovascular examination: Secondary | ICD-10-CM | POA: Insufficient documentation

## 2018-03-18 DIAGNOSIS — I447 Left bundle-branch block, unspecified: Secondary | ICD-10-CM | POA: Diagnosis not present

## 2018-03-18 DIAGNOSIS — Z79890 Hormone replacement therapy (postmenopausal): Secondary | ICD-10-CM

## 2018-03-18 DIAGNOSIS — Z888 Allergy status to other drugs, medicaments and biological substances status: Secondary | ICD-10-CM | POA: Diagnosis not present

## 2018-03-18 DIAGNOSIS — E7800 Pure hypercholesterolemia, unspecified: Secondary | ICD-10-CM

## 2018-03-18 DIAGNOSIS — I358 Other nonrheumatic aortic valve disorders: Secondary | ICD-10-CM | POA: Diagnosis not present

## 2018-03-18 DIAGNOSIS — D62 Acute posthemorrhagic anemia: Secondary | ICD-10-CM | POA: Diagnosis not present

## 2018-03-18 DIAGNOSIS — Z01812 Encounter for preprocedural laboratory examination: Secondary | ICD-10-CM | POA: Insufficient documentation

## 2018-03-18 DIAGNOSIS — R918 Other nonspecific abnormal finding of lung field: Secondary | ICD-10-CM | POA: Insufficient documentation

## 2018-03-18 DIAGNOSIS — I080 Rheumatic disorders of both mitral and aortic valves: Secondary | ICD-10-CM | POA: Diagnosis not present

## 2018-03-18 DIAGNOSIS — Z7982 Long term (current) use of aspirin: Secondary | ICD-10-CM | POA: Diagnosis not present

## 2018-03-18 DIAGNOSIS — Z86010 Personal history of colonic polyps: Secondary | ICD-10-CM | POA: Diagnosis not present

## 2018-03-18 DIAGNOSIS — J90 Pleural effusion, not elsewhere classified: Secondary | ICD-10-CM | POA: Diagnosis not present

## 2018-03-18 DIAGNOSIS — I10 Essential (primary) hypertension: Secondary | ICD-10-CM | POA: Diagnosis not present

## 2018-03-18 DIAGNOSIS — E559 Vitamin D deficiency, unspecified: Secondary | ICD-10-CM | POA: Diagnosis not present

## 2018-03-18 LAB — BLOOD GAS, ARTERIAL
Acid-Base Excess: 3.7 mmol/L — ABNORMAL HIGH (ref 0.0–2.0)
Bicarbonate: 27.5 mmol/L (ref 20.0–28.0)
DRAWN BY: 470591
FIO2: 21
O2 SAT: 98 %
PO2 ART: 107 mmHg (ref 83.0–108.0)
Patient temperature: 98.6
pCO2 arterial: 40.2 mmHg (ref 32.0–48.0)
pH, Arterial: 7.451 — ABNORMAL HIGH (ref 7.350–7.450)

## 2018-03-18 LAB — COMPREHENSIVE METABOLIC PANEL
ALBUMIN: 3.9 g/dL (ref 3.5–5.0)
ALK PHOS: 69 U/L (ref 38–126)
ALT: 25 U/L (ref 14–54)
ANION GAP: 9 (ref 5–15)
AST: 30 U/L (ref 15–41)
BUN: 12 mg/dL (ref 6–20)
CALCIUM: 9.5 mg/dL (ref 8.9–10.3)
CO2: 27 mmol/L (ref 22–32)
Chloride: 102 mmol/L (ref 101–111)
Creatinine, Ser: 0.45 mg/dL (ref 0.44–1.00)
GFR calc Af Amer: >60 mL/min (ref >60)
GFR calc non Af Amer: >60 mL/min (ref >60)
GLUCOSE: 93 mg/dL (ref 65–99)
POTASSIUM: 3.4 mmol/L — AB (ref 3.5–5.1)
SODIUM: 138 mmol/L (ref 135–145)
Total Bilirubin: 0.6 mg/dL (ref 0.3–1.2)
Total Protein: 6.8 g/dL (ref 6.5–8.1)

## 2018-03-18 LAB — TYPE AND SCREEN
ABO/RH(D): O POS
ANTIBODY SCREEN: NEGATIVE

## 2018-03-18 LAB — PULMONARY FUNCTION TEST
DL/VA % PRED: 104 %
DL/VA: 5.03 ml/min/mmHg/L
DLCO UNC % PRED: 78 %
DLCO UNC: 19.04 ml/min/mmHg
FEF 25-75 POST: 1.96 L/s
FEF 25-75 PRE: 1.48 L/s
FEF2575-%CHANGE-POST: 32 %
FEF2575-%PRED-POST: 121 %
FEF2575-%Pred-Pre: 91 %
FEV1-%CHANGE-POST: 4 %
FEV1-%Pred-Post: 91 %
FEV1-%Pred-Pre: 87 %
FEV1-Post: 1.91 L
FEV1-Pre: 1.82 L
FEV1FVC-%Change-Post: 5 %
FEV1FVC-%PRED-PRE: 101 %
FEV6-%Change-Post: 0 %
FEV6-%PRED-POST: 89 %
FEV6-%Pred-Pre: 90 %
FEV6-POST: 2.36 L
FEV6-Pre: 2.38 L
FEV6FVC-%PRED-POST: 105 %
FEV6FVC-%Pred-Pre: 105 %
FVC-%CHANGE-POST: 0 %
FVC-%PRED-POST: 84 %
FVC-%Pred-Pre: 85 %
FVC-PRE: 2.38 L
FVC-Post: 2.36 L
POST FEV1/FVC RATIO: 81 %
PRE FEV1/FVC RATIO: 76 %
PRE FEV6/FVC RATIO: 100 %
Post FEV6/FVC ratio: 100 %
RV % pred: 98 %
RV: 2.28 L
TLC % pred: 90 %
TLC: 4.59 L

## 2018-03-18 LAB — CBC
HCT: 39.1 % (ref 36.0–46.0)
HEMOGLOBIN: 12.9 g/dL (ref 12.0–15.0)
MCH: 31.5 pg (ref 26.0–34.0)
MCHC: 33 g/dL (ref 30.0–36.0)
MCV: 95.4 fL (ref 78.0–100.0)
Platelets: 257 10*3/uL (ref 150–400)
RBC: 4.1 MIL/uL (ref 3.87–5.11)
RDW: 13.5 % (ref 11.5–15.5)
WBC: 5.9 10*3/uL (ref 4.0–10.5)

## 2018-03-18 LAB — URINALYSIS, ROUTINE W REFLEX MICROSCOPIC
BILIRUBIN URINE: NEGATIVE
GLUCOSE, UA: NEGATIVE mg/dL
Hgb urine dipstick: NEGATIVE
KETONES UR: NEGATIVE mg/dL
Leukocytes, UA: NEGATIVE
Nitrite: NEGATIVE
PH: 7 (ref 5.0–8.0)
Protein, ur: NEGATIVE mg/dL
Specific Gravity, Urine: 1.01 (ref 1.005–1.030)

## 2018-03-18 LAB — HEMOGLOBIN A1C
Hgb A1c MFr Bld: 5.3 % (ref 4.8–5.6)
Mean Plasma Glucose: 105.41 mg/dL

## 2018-03-18 LAB — APTT: aPTT: 31 seconds (ref 24–36)

## 2018-03-18 LAB — PROTIME-INR
INR: 1.04
PROTHROMBIN TIME: 13.5 s (ref 11.4–15.2)

## 2018-03-18 LAB — SURGICAL PCR SCREEN
MRSA, PCR: NEGATIVE
Staphylococcus aureus: NEGATIVE

## 2018-03-18 MED ORDER — ALBUTEROL SULFATE (2.5 MG/3ML) 0.083% IN NEBU
2.5000 mg | INHALATION_SOLUTION | Freq: Once | RESPIRATORY_TRACT | Status: AC
  Administered 2018-03-18: 2.5 mg via RESPIRATORY_TRACT

## 2018-03-18 NOTE — Progress Notes (Signed)
Preliminary notes by tech--Doppler pre CABG completed.  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 Palmer arch patent and wnl bilateral radial/ulnar compression.   Hongying Vernee Baines(RDMS RVT) 03/18/18 12:36 PM

## 2018-03-18 NOTE — Progress Notes (Signed)
PCP - Gaynelle Arabian Cardiologist - Nishan  Chest x-ray - 03/18/18 EKG - 03/18/18 Stress Test - denies ECHO - 2019 Cardiac Cath - 2019    Aspirin Instructions: last dose 3-7  Anesthesia review: yes   Patient denies shortness of breath, fever, cough and chest pain at PAT appointment   Patient verbalized understanding of instructions that were given to them at the PAT appointment. Patient was also instructed that they will need to review over the PAT instructions again at home before surgery.

## 2018-03-19 ENCOUNTER — Encounter (HOSPITAL_COMMUNITY): Payer: Self-pay | Admitting: Certified Registered Nurse Anesthetist

## 2018-03-19 MED ORDER — TRANEXAMIC ACID (OHS) PUMP PRIME SOLUTION
2.0000 mg/kg | INTRAVENOUS | Status: DC
  Filled 2018-03-19: qty 1.32

## 2018-03-19 MED ORDER — MAGNESIUM SULFATE 50 % IJ SOLN
40.0000 meq | INTRAMUSCULAR | Status: DC
  Filled 2018-03-19: qty 9.85

## 2018-03-19 MED ORDER — SODIUM CHLORIDE 0.9 % IV SOLN
INTRAVENOUS | Status: DC
  Filled 2018-03-19: qty 30

## 2018-03-19 MED ORDER — PLASMA-LYTE 148 IV SOLN
INTRAVENOUS | Status: AC
  Administered 2018-03-20: 500 mL
  Filled 2018-03-19: qty 2.5

## 2018-03-19 MED ORDER — SODIUM CHLORIDE 0.9 % IV SOLN
INTRAVENOUS | Status: DC
  Filled 2018-03-19: qty 1

## 2018-03-19 MED ORDER — SODIUM CHLORIDE 0.9 % IV SOLN
INTRAVENOUS | Status: AC
  Administered 2018-03-20: 08:00:00
  Filled 2018-03-19: qty 1000

## 2018-03-19 MED ORDER — PHENYLEPHRINE HCL 10 MG/ML IJ SOLN
30.0000 ug/min | INTRAMUSCULAR | Status: DC
  Filled 2018-03-19: qty 2

## 2018-03-19 MED ORDER — KENNESTONE BLOOD CARDIOPLEGIA (KBC) MANNITOL SYRINGE (20%, 32ML)
32.0000 mL | Freq: Once | INTRAVENOUS | Status: DC
  Filled 2018-03-19: qty 32

## 2018-03-19 MED ORDER — TRANEXAMIC ACID 1000 MG/10ML IV SOLN
1.5000 mg/kg/h | INTRAVENOUS | Status: DC
  Filled 2018-03-19: qty 25

## 2018-03-19 MED ORDER — DOPAMINE-DEXTROSE 3.2-5 MG/ML-% IV SOLN
0.0000 ug/kg/min | INTRAVENOUS | Status: DC
  Filled 2018-03-19: qty 250

## 2018-03-19 MED ORDER — MILRINONE LACTATE IN DEXTROSE 20-5 MG/100ML-% IV SOLN
0.1250 ug/kg/min | INTRAVENOUS | Status: DC
  Filled 2018-03-19: qty 100

## 2018-03-19 MED ORDER — POTASSIUM CHLORIDE 2 MEQ/ML IV SOLN
80.0000 meq | INTRAVENOUS | Status: DC
  Filled 2018-03-19: qty 40

## 2018-03-19 MED ORDER — KENNESTONE BLOOD CARDIOPLEGIA VIAL
13.0000 mL | Freq: Once | Status: DC
  Filled 2018-03-19: qty 13

## 2018-03-19 MED ORDER — CEFUROXIME SODIUM 750 MG IJ SOLR
750.0000 mg | INTRAMUSCULAR | Status: DC
  Filled 2018-03-19: qty 750

## 2018-03-19 MED ORDER — SODIUM CHLORIDE 0.9 % IV SOLN
1250.0000 mg | INTRAVENOUS | Status: DC
  Filled 2018-03-19: qty 1250

## 2018-03-19 MED ORDER — SODIUM CHLORIDE 0.9 % IV SOLN
1.5000 g | INTRAVENOUS | Status: AC
  Administered 2018-03-20: .75 g via INTRAVENOUS
  Administered 2018-03-20: 1.5 g via INTRAVENOUS
  Filled 2018-03-19: qty 1.5

## 2018-03-19 MED ORDER — EPINEPHRINE PF 1 MG/ML IJ SOLN
0.0000 ug/min | INTRAVENOUS | Status: DC
  Filled 2018-03-19: qty 4

## 2018-03-19 MED ORDER — DEXMEDETOMIDINE HCL IN NACL 400 MCG/100ML IV SOLN
0.1000 ug/kg/h | INTRAVENOUS | Status: DC
  Filled 2018-03-19: qty 100

## 2018-03-19 MED ORDER — TRANEXAMIC ACID (OHS) BOLUS VIA INFUSION
15.0000 mg/kg | INTRAVENOUS | Status: DC
  Filled 2018-03-19: qty 993

## 2018-03-19 MED ORDER — NITROGLYCERIN IN D5W 200-5 MCG/ML-% IV SOLN
2.0000 ug/min | INTRAVENOUS | Status: DC
  Filled 2018-03-19: qty 250

## 2018-03-19 NOTE — Anesthesia Preprocedure Evaluation (Addendum)
Anesthesia Evaluation  Patient identified by MRN, date of birth, ID band Patient awake    Reviewed: Allergy & Precautions, NPO status , Patient's Chart, lab work & pertinent test results  History of Anesthesia Complications Negative for: history of anesthetic complications  Airway Mallampati: II  TM Distance: >3 FB Neck ROM: Full    Dental  (+) Dental Advisory Given   Pulmonary asthma ,    breath sounds clear to auscultation       Cardiovascular hypertension, Pt. on medications (-) CAD  Rhythm:Regular Rate:Normal + Systolic murmurs 1/06 ECHO: EF 55-60%, severe AS with mean grad 52 mmHg, peak grad 86 mm Hg,AVA (VTI) 0.71 cm2,  mild TR   Neuro/Psych negative neurological ROS     GI/Hepatic negative GI ROS, Neg liver ROS,   Endo/Other  Hypothyroidism   Renal/GU negative Renal ROS     Musculoskeletal  (+) Arthritis ,   Abdominal   Peds  Hematology negative hematology ROS (+)   Anesthesia Other Findings   Reproductive/Obstetrics                            Anesthesia Physical Anesthesia Plan  ASA: III  Anesthesia Plan: General   Post-op Pain Management:    Induction: Intravenous  PONV Risk Score and Plan: 3 and Treatment may vary due to age or medical condition  Airway Management Planned: Oral ETT  Additional Equipment: Arterial line, PA Cath, TEE and Ultrasound Guidance Line Placement  Intra-op Plan:   Post-operative Plan: Post-operative intubation/ventilation  Informed Consent: I have reviewed the patients History and Physical, chart, labs and discussed the procedure including the risks, benefits and alternatives for the proposed anesthesia with the patient or authorized representative who has indicated his/her understanding and acceptance.   Dental advisory given  Plan Discussed with: CRNA and Surgeon  Anesthesia Plan Comments: (Plan routine monitors, A line, PA cath, GETA  with TEE and post op ventilation)       Anesthesia Quick Evaluation

## 2018-03-19 NOTE — Progress Notes (Signed)
Anesthesia Chart Review: Patient is a 77 year old female scheduled for AVR on 03/20/18 by Dr. Darylene Price.  History includes never smoker, critical AS, HTN, asthmatic bronchitis, hypothyroidism, hypercholesterolemia, tonsillectomy, right THA 09/06/13.   PCP is Dr. Gaynelle Arabian. Cardiologist is Dr. Jenkins Rouge.  Meds include amoxicillin (PRN dental procedures), ASA 81 mg (on hold), HCTZ, levothyroxine.  BP (!) 158/56   Pulse 69   Temp 36.6 C   Resp 18   Ht 5' 2.5" (1.588 m)   Wt 146 lb (66.2 kg)   SpO2 98%   BMI 26.28 kg/m   EKG 03/18/18: NSR, possible LAE.   Echo 01/01/18: Study Conclusions - Left ventricle: The cavity size was normal. Systolic function was   normal. The estimated ejection fraction was in the range of 55%   to 60%. Wall motion was normal; there were no regional wall   motion abnormalities. Doppler parameters are consistent with   abnormal left ventricular relaxation (grade 1 diastolic   dysfunction). Doppler parameters are consistent with elevated   ventricular end-diastolic filling pressure. - Aortic valve: Trileaflet; severely thickened, severely calcified   leaflets. Valve mobility was restricted. There was critical   stenosis. Mean gradient (S): 52 mm Hg. Peak gradient (S): 86 mm   Hg. Valve area (VTI): 0.71 cm^2. Valve area (Vmax): 0.71 cm^2.   Valve area (Vmean): 0.62 cm^2. - Mitral valve: There was no regurgitation. - Right ventricle: The cavity size was normal. Wall thickness was   normal. Systolic function was normal. - Tricuspid valve: There was mild regurgitation. - Pulmonary arteries: Systolic pressure was within the normal   range. - Inferior vena cava: The vessel was normal in size. - Pericardium, extracardiac: There was no pericardial effusion.  Cardiac cath 02/16/18: Conclusions:   The left ventricular systolic function is normal.  LV end diastolic pressure is normal.  The left ventricular ejection fraction is greater than 65% by visual  estimate.  There is severe aortic valve stenosis. 1. Normal coronary anatomy 2. Severe aortic stenosis. Mean gradient 30 mm Hg. Valve area 0.72 cm squared, index 0.42 3. Normal LV filling pressures. 4. Normal right heart pressures 5. Normal cardiac output. Plan: referral for AVR.  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  Preoperative cardiac CT, CTA chest/abd/pelvis reports reviewed.    CXR 03/18/18: IMPRESSION: Stable cardiomegaly with aortic atherosclerosis. Mild emphysematous hyperinflation of lungs. No acute pneumonic consolidation.  PFTs 03/18/18: FVC 2.38 (85%), FEV1 1.82 (87%), DLCO unc 19.04 (78%).   Preoperative labs noted.   If no acute changes then I anticipate that she can proceed as planned.  George Hugh [facility]/Anesthesiology Phone [phone] 03/19/2018 10:05 AM

## 2018-03-20 ENCOUNTER — Inpatient Hospital Stay (HOSPITAL_COMMUNITY)
Admission: RE | Admit: 2018-03-20 | Discharge: 2018-03-24 | DRG: 220 | Disposition: A | Discharge status: Home / Self Care | Payer: Medicare HMO | Source: Ambulatory Visit | Attending: Thoracic Surgery (Cardiothoracic Vascular Surgery) | Admitting: Thoracic Surgery (Cardiothoracic Vascular Surgery)

## 2018-03-20 ENCOUNTER — Encounter (HOSPITAL_COMMUNITY)
Admission: RE | Disposition: A | Discharge status: Home / Self Care | Payer: Self-pay | Source: Ambulatory Visit | Attending: Thoracic Surgery (Cardiothoracic Vascular Surgery)

## 2018-03-20 ENCOUNTER — Inpatient Hospital Stay (HOSPITAL_COMMUNITY): Payer: Medicare HMO | Admitting: Emergency Medicine

## 2018-03-20 ENCOUNTER — Inpatient Hospital Stay (HOSPITAL_COMMUNITY): Payer: Medicare HMO | Admitting: Certified Registered Nurse Anesthetist

## 2018-03-20 ENCOUNTER — Other Ambulatory Visit: Payer: Self-pay

## 2018-03-20 ENCOUNTER — Inpatient Hospital Stay (HOSPITAL_COMMUNITY): Payer: Medicare HMO

## 2018-03-20 ENCOUNTER — Encounter (HOSPITAL_COMMUNITY): Payer: Self-pay

## 2018-03-20 DIAGNOSIS — Z96641 Presence of right artificial hip joint: Secondary | ICD-10-CM | POA: Diagnosis present

## 2018-03-20 DIAGNOSIS — E785 Hyperlipidemia, unspecified: Secondary | ICD-10-CM | POA: Diagnosis present

## 2018-03-20 DIAGNOSIS — E039 Hypothyroidism, unspecified: Secondary | ICD-10-CM | POA: Diagnosis present

## 2018-03-20 DIAGNOSIS — Z888 Allergy status to other drugs, medicaments and biological substances status: Secondary | ICD-10-CM

## 2018-03-20 DIAGNOSIS — R011 Cardiac murmur, unspecified: Secondary | ICD-10-CM | POA: Diagnosis present

## 2018-03-20 DIAGNOSIS — I350 Nonrheumatic aortic (valve) stenosis: Principal | ICD-10-CM | POA: Diagnosis present

## 2018-03-20 DIAGNOSIS — J9811 Atelectasis: Secondary | ICD-10-CM | POA: Diagnosis not present

## 2018-03-20 DIAGNOSIS — Z8371 Family history of colonic polyps: Secondary | ICD-10-CM | POA: Diagnosis not present

## 2018-03-20 DIAGNOSIS — I10 Essential (primary) hypertension: Secondary | ICD-10-CM | POA: Diagnosis present

## 2018-03-20 DIAGNOSIS — Z79899 Other long term (current) drug therapy: Secondary | ICD-10-CM | POA: Diagnosis not present

## 2018-03-20 DIAGNOSIS — D62 Acute posthemorrhagic anemia: Secondary | ICD-10-CM | POA: Diagnosis not present

## 2018-03-20 DIAGNOSIS — Z86010 Personal history of colonic polyps: Secondary | ICD-10-CM | POA: Diagnosis not present

## 2018-03-20 DIAGNOSIS — Z79890 Hormone replacement therapy (postmenopausal): Secondary | ICD-10-CM

## 2018-03-20 DIAGNOSIS — D6959 Other secondary thrombocytopenia: Secondary | ICD-10-CM | POA: Diagnosis not present

## 2018-03-20 DIAGNOSIS — Z7982 Long term (current) use of aspirin: Secondary | ICD-10-CM | POA: Diagnosis not present

## 2018-03-20 DIAGNOSIS — E8770 Fluid overload, unspecified: Secondary | ICD-10-CM | POA: Diagnosis not present

## 2018-03-20 DIAGNOSIS — I447 Left bundle-branch block, unspecified: Secondary | ICD-10-CM | POA: Diagnosis not present

## 2018-03-20 DIAGNOSIS — I700 Atherosclerosis of aorta: Secondary | ICD-10-CM | POA: Diagnosis present

## 2018-03-20 DIAGNOSIS — Z881 Allergy status to other antibiotic agents status: Secondary | ICD-10-CM

## 2018-03-20 DIAGNOSIS — I358 Other nonrheumatic aortic valve disorders: Secondary | ICD-10-CM | POA: Diagnosis not present

## 2018-03-20 DIAGNOSIS — Z953 Presence of xenogenic heart valve: Secondary | ICD-10-CM

## 2018-03-20 HISTORY — PX: TEE WITHOUT CARDIOVERSION: SHX5443

## 2018-03-20 HISTORY — PX: AORTIC VALVE REPLACEMENT: SHX41

## 2018-03-20 HISTORY — DX: Presence of xenogenic heart valve: Z95.3

## 2018-03-20 LAB — POCT I-STAT, CHEM 8
BUN: 13 mg/dL (ref 6–20)
BUN: 11 mg/dL (ref 6–20)
BUN: 12 mg/dL (ref 6–20)
BUN: 12 mg/dL (ref 6–20)
BUN: 10 mg/dL (ref 6–20)
BUN: 11 mg/dL (ref 6–20)
CHLORIDE: 103 mmol/L (ref 101–111)
CHLORIDE: 104 mmol/L (ref 101–111)
CHLORIDE: 107 mmol/L (ref 101–111)
CREATININE: 0.4 mg/dL — AB (ref 0.44–1.00)
Calcium, Ion: 1.26 mmol/L (ref 1.15–1.40)
Calcium, Ion: 1.04 mmol/L — ABNORMAL LOW (ref 1.15–1.40)
Calcium, Ion: 1.08 mmol/L — ABNORMAL LOW (ref 1.15–1.40)
Calcium, Ion: 1.24 mmol/L (ref 1.15–1.40)
Calcium, Ion: 1.05 mmol/L — ABNORMAL LOW (ref 1.15–1.40)
Calcium, Ion: 1.17 mmol/L (ref 1.15–1.40)
Chloride: 102 mmol/L (ref 101–111)
Chloride: 103 mmol/L (ref 101–111)
Chloride: 104 mmol/L (ref 101–111)
Creatinine, Ser: 0.4 mg/dL — ABNORMAL LOW (ref 0.44–1.00)
Creatinine, Ser: 0.4 mg/dL — ABNORMAL LOW (ref 0.44–1.00)
Creatinine, Ser: 0.4 mg/dL — ABNORMAL LOW (ref 0.44–1.00)
Creatinine, Ser: 0.4 mg/dL — ABNORMAL LOW (ref 0.44–1.00)
Creatinine, Ser: 0.4 mg/dL — ABNORMAL LOW (ref 0.44–1.00)
GLUCOSE: 109 mg/dL — AB (ref 65–99)
GLUCOSE: 115 mg/dL — AB (ref 65–99)
Glucose, Bld: 94 mg/dL (ref 65–99)
Glucose, Bld: 102 mg/dL — ABNORMAL HIGH (ref 65–99)
Glucose, Bld: 118 mg/dL — ABNORMAL HIGH (ref 65–99)
Glucose, Bld: 95 mg/dL (ref 65–99)
HCT: 23 % — ABNORMAL LOW (ref 36.0–46.0)
HCT: 22 % — ABNORMAL LOW (ref 36.0–46.0)
HEMATOCRIT: 31 % — AB (ref 36.0–46.0)
HEMATOCRIT: 23 % — AB (ref 36.0–46.0)
HEMATOCRIT: 29 % — AB (ref 36.0–46.0)
HEMATOCRIT: 26 % — AB (ref 36.0–46.0)
HEMOGLOBIN: 7.8 g/dL — AB (ref 12.0–15.0)
HEMOGLOBIN: 9.9 g/dL — AB (ref 12.0–15.0)
HEMOGLOBIN: 8.8 g/dL — AB (ref 12.0–15.0)
Hemoglobin: 10.5 g/dL — ABNORMAL LOW (ref 12.0–15.0)
Hemoglobin: 7.8 g/dL — ABNORMAL LOW (ref 12.0–15.0)
Hemoglobin: 7.5 g/dL — ABNORMAL LOW (ref 12.0–15.0)
POTASSIUM: 4.2 mmol/L (ref 3.5–5.1)
POTASSIUM: 4.6 mmol/L (ref 3.5–5.1)
POTASSIUM: 3.9 mmol/L (ref 3.5–5.1)
POTASSIUM: 3.8 mmol/L (ref 3.5–5.1)
Potassium: 3.6 mmol/L (ref 3.5–5.1)
Potassium: 3.4 mmol/L — ABNORMAL LOW (ref 3.5–5.1)
SODIUM: 142 mmol/L (ref 135–145)
SODIUM: 142 mmol/L (ref 135–145)
Sodium: 141 mmol/L (ref 135–145)
Sodium: 143 mmol/L (ref 135–145)
Sodium: 143 mmol/L (ref 135–145)
Sodium: 144 mmol/L (ref 135–145)
TCO2: 31 mmol/L (ref 22–32)
TCO2: 29 mmol/L (ref 22–32)
TCO2: 29 mmol/L (ref 22–32)
TCO2: 30 mmol/L (ref 22–32)
TCO2: 27 mmol/L (ref 22–32)
TCO2: 23 mmol/L (ref 22–32)

## 2018-03-20 LAB — GLUCOSE, CAPILLARY
GLUCOSE-CAPILLARY: 122 mg/dL — AB (ref 65–99)
GLUCOSE-CAPILLARY: 116 mg/dL — AB (ref 65–99)
Glucose-Capillary: 128 mg/dL — ABNORMAL HIGH (ref 65–99)
Glucose-Capillary: 116 mg/dL — ABNORMAL HIGH (ref 65–99)
Glucose-Capillary: 123 mg/dL — ABNORMAL HIGH (ref 65–99)
Glucose-Capillary: 115 mg/dL — ABNORMAL HIGH (ref 65–99)
Glucose-Capillary: 116 mg/dL — ABNORMAL HIGH (ref 65–99)
Glucose-Capillary: 107 mg/dL — ABNORMAL HIGH (ref 65–99)

## 2018-03-20 LAB — POCT I-STAT 3, ART BLOOD GAS (G3+)
Acid-Base Excess: 3 mmol/L — ABNORMAL HIGH (ref 0.0–2.0)
Acid-base deficit: 1 mmol/L (ref 0.0–2.0)
BICARBONATE: 26.4 mmol/L (ref 20.0–28.0)
BICARBONATE: 25.5 mmol/L (ref 20.0–28.0)
Bicarbonate: 24.8 mmol/L (ref 20.0–28.0)
Bicarbonate: 25.6 mmol/L (ref 20.0–28.0)
O2 SAT: 99 %
O2 Saturation: 100 %
O2 Saturation: 97 %
O2 Saturation: 98 %
PCO2 ART: 42.6 mmHg (ref 32.0–48.0)
PCO2 ART: 43.9 mmHg (ref 32.0–48.0)
PCO2 ART: 44.6 mmHg (ref 32.0–48.0)
PH ART: 7.484 — AB (ref 7.350–7.450)
PH ART: 7.366 (ref 7.350–7.450)
PH ART: 7.37 (ref 7.350–7.450)
PH ART: 7.364 (ref 7.350–7.450)
PO2 ART: 313 mmHg — AB (ref 83.0–108.0)
PO2 ART: 111 mmHg — AB (ref 83.0–108.0)
PO2 ART: 155 mmHg — AB (ref 83.0–108.0)
Patient temperature: 36.4
Patient temperature: 36.4
TCO2: 27 mmol/L (ref 22–32)
TCO2: 26 mmol/L (ref 22–32)
TCO2: 27 mmol/L (ref 22–32)
TCO2: 27 mmol/L (ref 22–32)
pCO2 arterial: 35.2 mmHg (ref 32.0–48.0)
pO2, Arterial: 91 mmHg (ref 83.0–108.0)

## 2018-03-20 LAB — CBC
HCT: 27.7 % — ABNORMAL LOW (ref 36.0–46.0)
HCT: 29 % — ABNORMAL LOW (ref 36.0–46.0)
Hemoglobin: 9.1 g/dL — ABNORMAL LOW (ref 12.0–15.0)
Hemoglobin: 9.4 g/dL — ABNORMAL LOW (ref 12.0–15.0)
MCH: 30.8 pg (ref 26.0–34.0)
MCH: 30.6 pg (ref 26.0–34.0)
MCHC: 32.9 g/dL (ref 30.0–36.0)
MCHC: 32.4 g/dL (ref 30.0–36.0)
MCV: 93.9 fL (ref 78.0–100.0)
MCV: 94.5 fL (ref 78.0–100.0)
PLATELETS: 135 10*3/uL — AB (ref 150–400)
PLATELETS: 139 10*3/uL — AB (ref 150–400)
RBC: 2.95 MIL/uL — ABNORMAL LOW (ref 3.87–5.11)
RBC: 3.07 MIL/uL — AB (ref 3.87–5.11)
RDW: 13.4 % (ref 11.5–15.5)
RDW: 13.6 % (ref 11.5–15.5)
WBC: 11.6 10*3/uL — AB (ref 4.0–10.5)
WBC: 11.8 10*3/uL — ABNORMAL HIGH (ref 4.0–10.5)

## 2018-03-20 LAB — PLATELET COUNT: PLATELETS: 185 10*3/uL (ref 150–400)

## 2018-03-20 LAB — PROTIME-INR
INR: 1.57
Prothrombin Time: 18.6 seconds — ABNORMAL HIGH (ref 11.4–15.2)

## 2018-03-20 LAB — MAGNESIUM: MAGNESIUM: 3 mg/dL — AB (ref 1.7–2.4)

## 2018-03-20 LAB — POCT I-STAT 4, (NA,K, GLUC, HGB,HCT)
Glucose, Bld: 105 mg/dL — ABNORMAL HIGH (ref 65–99)
HEMATOCRIT: 26 % — AB (ref 36.0–46.0)
HEMOGLOBIN: 8.8 g/dL — AB (ref 12.0–15.0)
Potassium: 3.5 mmol/L (ref 3.5–5.1)
Sodium: 145 mmol/L (ref 135–145)

## 2018-03-20 LAB — APTT: APTT: 39 s — AB (ref 24–36)

## 2018-03-20 LAB — CREATININE, SERUM
Creatinine, Ser: 0.55 mg/dL (ref 0.44–1.00)
GFR calc non Af Amer: >60 mL/min (ref >60)

## 2018-03-20 LAB — HEMOGLOBIN AND HEMATOCRIT, BLOOD
HCT: 24.1 % — ABNORMAL LOW (ref 36.0–46.0)
Hemoglobin: 8.1 g/dL — ABNORMAL LOW (ref 12.0–15.0)

## 2018-03-20 SURGERY — REPLACEMENT, AORTIC VALVE, OPEN
Anesthesia: General | Site: Chest

## 2018-03-20 MED ORDER — THROMBIN (RECOMBINANT) 20000 UNITS EX SOLR
OROMUCOSAL | Status: DC | PRN
  Administered 2018-03-20 (×2): via TOPICAL

## 2018-03-20 MED ORDER — SODIUM CHLORIDE 0.45 % IV SOLN
INTRAVENOUS | Status: DC | PRN
  Administered 2018-03-20: 20 mL/h via INTRAVENOUS

## 2018-03-20 MED ORDER — NITROGLYCERIN IN D5W 200-5 MCG/ML-% IV SOLN: 0.0000 ug/min | INTRAVENOUS | Status: DC

## 2018-03-20 MED ORDER — SODIUM CHLORIDE 0.9 % IV SOLN
0.0000 ug/min | INTRAVENOUS | Status: DC
  Filled 2018-03-20: qty 2

## 2018-03-20 MED ORDER — FENTANYL CITRATE (PF) 250 MCG/5ML IJ SOLN
INTRAMUSCULAR | Status: DC | PRN
  Administered 2018-03-20 (×2): 100 ug via INTRAVENOUS
  Administered 2018-03-20 (×2): 50 ug via INTRAVENOUS
  Administered 2018-03-20: 150 ug via INTRAVENOUS
  Administered 2018-03-20: 100 ug via INTRAVENOUS
  Administered 2018-03-20: 450 ug via INTRAVENOUS

## 2018-03-20 MED ORDER — MORPHINE SULFATE (PF) 2 MG/ML IV SOLN
1.0000 mg | INTRAVENOUS | Status: DC | PRN
  Administered 2018-03-20: 2 mg via INTRAVENOUS
  Filled 2018-03-20: qty 1

## 2018-03-20 MED ORDER — LACTATED RINGERS IV SOLN: INTRAVENOUS | Status: DC

## 2018-03-20 MED ORDER — SODIUM CHLORIDE 0.9% FLUSH
3.0000 mL | Freq: Two times a day (BID) | INTRAVENOUS | Status: DC
  Administered 2018-03-21 – 2018-03-23 (×6): 3 mL via INTRAVENOUS

## 2018-03-20 MED ORDER — SODIUM CHLORIDE 0.9 % IV SOLN
INTRAVENOUS | Status: DC | PRN
  Administered 2018-03-20: .7 [IU]/h via INTRAVENOUS

## 2018-03-20 MED ORDER — ARTIFICIAL TEARS OPHTHALMIC OINT
TOPICAL_OINTMENT | OPHTHALMIC | Status: AC
  Filled 2018-03-20: qty 3.5

## 2018-03-20 MED ORDER — ACETAMINOPHEN 650 MG RE SUPP
650.0000 mg | Freq: Once | RECTAL | Status: AC
  Administered 2018-03-20: 650 mg via RECTAL

## 2018-03-20 MED ORDER — ROCURONIUM 10MG/ML (10ML) SYRINGE FOR MEDFUSION PUMP - OPTIME
INTRAVENOUS | Status: DC | PRN
  Administered 2018-03-20 (×3): 50 mg via INTRAVENOUS

## 2018-03-20 MED ORDER — CHLORHEXIDINE GLUCONATE 4 % EX LIQD: 30.0000 mL | CUTANEOUS | Status: DC

## 2018-03-20 MED ORDER — DEXMEDETOMIDINE HCL IN NACL 200 MCG/50ML IV SOLN
INTRAVENOUS | Status: DC | PRN
  Administered 2018-03-20: .3 ug/kg/h via INTRAVENOUS

## 2018-03-20 MED ORDER — THROMBIN 20000 UNITS EX SOLR
CUTANEOUS | Status: AC
  Filled 2018-03-20: qty 20000

## 2018-03-20 MED ORDER — BISACODYL 10 MG RE SUPP: 10.0000 mg | Freq: Every day | RECTAL | Status: DC

## 2018-03-20 MED ORDER — ASPIRIN EC 325 MG PO TBEC
325.0000 mg | DELAYED_RELEASE_TABLET | Freq: Every day | ORAL | Status: DC
  Administered 2018-03-21: 325 mg via ORAL
  Filled 2018-03-20: qty 1

## 2018-03-20 MED ORDER — POTASSIUM CHLORIDE 10 MEQ/50ML IV SOLN
10.0000 meq | INTRAVENOUS | Status: AC
  Administered 2018-03-20 (×3): 10 meq via INTRAVENOUS

## 2018-03-20 MED ORDER — SODIUM CHLORIDE 0.9 % IV SOLN
0.0000 ug/kg/h | INTRAVENOUS | Status: DC
  Filled 2018-03-20: qty 2

## 2018-03-20 MED ORDER — MORPHINE SULFATE (PF) 2 MG/ML IV SOLN: 1.0000 mg | INTRAVENOUS | Status: DC | PRN

## 2018-03-20 MED ORDER — LIDOCAINE HCL (CARDIAC) 20 MG/ML IV SOLN
INTRAVENOUS | Status: AC
  Filled 2018-03-20: qty 5

## 2018-03-20 MED ORDER — LACTATED RINGERS IV SOLN
INTRAVENOUS | Status: DC
  Administered 2018-03-21: 20 mL/h via INTRAVENOUS

## 2018-03-20 MED ORDER — ONDANSETRON HCL 4 MG/2ML IJ SOLN
4.0000 mg | Freq: Four times a day (QID) | INTRAMUSCULAR | Status: DC | PRN
  Administered 2018-03-20 – 2018-03-22 (×4): 4 mg via INTRAVENOUS
  Filled 2018-03-20 (×4): qty 2

## 2018-03-20 MED ORDER — FENTANYL CITRATE (PF) 250 MCG/5ML IJ SOLN
INTRAMUSCULAR | Status: AC
  Filled 2018-03-20: qty 25

## 2018-03-20 MED ORDER — CHLORHEXIDINE GLUCONATE 0.12 % MT SOLN
15.0000 mL | Freq: Once | OROMUCOSAL | Status: AC
  Administered 2018-03-20: 15 mL via OROMUCOSAL

## 2018-03-20 MED ORDER — INSULIN ASPART 100 UNIT/ML ~~LOC~~ SOLN
0.0000 [IU] | SUBCUTANEOUS | Status: DC
  Administered 2018-03-21 (×2): 2 [IU] via SUBCUTANEOUS

## 2018-03-20 MED ORDER — ARTIFICIAL TEARS OPHTHALMIC OINT
TOPICAL_OINTMENT | OPHTHALMIC | Status: DC | PRN
  Administered 2018-03-20: 1 via OPHTHALMIC

## 2018-03-20 MED ORDER — METOPROLOL TARTRATE 12.5 MG HALF TABLET
ORAL_TABLET | ORAL | Status: AC
  Administered 2018-03-20: 12.5 mg via ORAL
  Filled 2018-03-20: qty 1

## 2018-03-20 MED ORDER — CEFAZOLIN SODIUM-DEXTROSE 2-4 GM/100ML-% IV SOLN
2.0000 g | Freq: Three times a day (TID) | INTRAVENOUS | Status: AC
  Administered 2018-03-20 – 2018-03-22 (×6): 2 g via INTRAVENOUS
  Filled 2018-03-20 (×6): qty 100

## 2018-03-20 MED ORDER — PHENYLEPHRINE HCL 10 MG/ML IJ SOLN
INTRAVENOUS | Status: DC | PRN
  Administered 2018-03-20: 20 ug/min via INTRAVENOUS

## 2018-03-20 MED ORDER — SODIUM CHLORIDE 0.9 % IV SOLN: 250.0000 mL | INTRAVENOUS | Status: DC

## 2018-03-20 MED ORDER — LACTATED RINGERS IV SOLN
INTRAVENOUS | Status: DC | PRN
  Administered 2018-03-20: 07:00:00 via INTRAVENOUS

## 2018-03-20 MED ORDER — SODIUM CHLORIDE 0.9 % IV SOLN
INTRAVENOUS | Status: DC
  Filled 2018-03-20: qty 1

## 2018-03-20 MED ORDER — PROPOFOL 10 MG/ML IV BOLUS
INTRAVENOUS | Status: AC
  Filled 2018-03-20: qty 20

## 2018-03-20 MED ORDER — LACTATED RINGERS IV SOLN
INTRAVENOUS | Status: DC | PRN
  Administered 2018-03-20: 06:00:00 via INTRAVENOUS

## 2018-03-20 MED ORDER — INSULIN REGULAR BOLUS VIA INFUSION
0.0000 [IU] | Freq: Three times a day (TID) | INTRAVENOUS | Status: DC
  Filled 2018-03-20: qty 10

## 2018-03-20 MED ORDER — ALBUMIN HUMAN 5 % IV SOLN
INTRAVENOUS | Status: DC | PRN
  Administered 2018-03-20: 11:00:00 via INTRAVENOUS

## 2018-03-20 MED ORDER — HEPARIN SODIUM (PORCINE) 1000 UNIT/ML IJ SOLN
INTRAMUSCULAR | Status: DC | PRN
  Administered 2018-03-20: 20000 [IU] via INTRAVENOUS

## 2018-03-20 MED ORDER — METOPROLOL TARTRATE 12.5 MG HALF TABLET
12.5000 mg | ORAL_TABLET | Freq: Once | ORAL | Status: AC
  Administered 2018-03-20: 12.5 mg via ORAL

## 2018-03-20 MED ORDER — MAGNESIUM SULFATE 4 GM/100ML IV SOLN
4.0000 g | Freq: Once | INTRAVENOUS | Status: AC
  Administered 2018-03-20: 4 g via INTRAVENOUS
  Filled 2018-03-20: qty 100

## 2018-03-20 MED ORDER — SODIUM CHLORIDE 0.9 % IV SOLN
INTRAVENOUS | Status: AC
  Administered 2018-03-20: 100 mL/h via INTRAVENOUS

## 2018-03-20 MED ORDER — SODIUM CHLORIDE 0.9 % IJ SOLN
OROMUCOSAL | Status: DC | PRN
  Administered 2018-03-20: 09:00:00 via TOPICAL
  Administered 2018-03-20 (×2): 4 mL via TOPICAL

## 2018-03-20 MED ORDER — PANTOPRAZOLE SODIUM 40 MG PO TBEC
40.0000 mg | DELAYED_RELEASE_TABLET | Freq: Every day | ORAL | Status: DC
  Administered 2018-03-22 – 2018-03-24 (×3): 40 mg via ORAL
  Filled 2018-03-20 (×3): qty 1

## 2018-03-20 MED ORDER — ACETAMINOPHEN 500 MG PO TABS
1000.0000 mg | ORAL_TABLET | Freq: Four times a day (QID) | ORAL | Status: DC
  Administered 2018-03-21 – 2018-03-23 (×9): 1000 mg via ORAL
  Filled 2018-03-20 (×11): qty 2

## 2018-03-20 MED ORDER — SODIUM CHLORIDE 0.9 % IV SOLN
INTRAVENOUS | Status: DC | PRN
  Administered 2018-03-20: 15 mg/kg/h via INTRAVENOUS

## 2018-03-20 MED ORDER — LACTATED RINGERS IV SOLN: 500.0000 mL | Freq: Once | INTRAVENOUS | Status: DC | PRN

## 2018-03-20 MED ORDER — TRAMADOL HCL 50 MG PO TABS: 50.0000 mg | ORAL_TABLET | ORAL | Status: DC | PRN

## 2018-03-20 MED ORDER — LIDOCAINE 2% (20 MG/ML) 5 ML SYRINGE
INTRAMUSCULAR | Status: DC | PRN
  Administered 2018-03-20: 20 mg via INTRAVENOUS

## 2018-03-20 MED ORDER — VANCOMYCIN HCL IN DEXTROSE 1-5 GM/200ML-% IV SOLN
1000.0000 mg | Freq: Once | INTRAVENOUS | Status: AC
  Administered 2018-03-20: 1000 mg via INTRAVENOUS
  Filled 2018-03-20: qty 200

## 2018-03-20 MED ORDER — BISACODYL 5 MG PO TBEC
10.0000 mg | DELAYED_RELEASE_TABLET | Freq: Every day | ORAL | Status: DC
  Administered 2018-03-21 – 2018-03-23 (×3): 10 mg via ORAL
  Filled 2018-03-20 (×3): qty 2

## 2018-03-20 MED ORDER — ACETAMINOPHEN 160 MG/5ML PO SOLN: 1000.0000 mg | Freq: Four times a day (QID) | ORAL | Status: DC

## 2018-03-20 MED ORDER — MIDAZOLAM HCL 2 MG/2ML IJ SOLN
INTRAMUSCULAR | Status: DC | PRN
  Administered 2018-03-20 (×4): 2 mg via INTRAVENOUS

## 2018-03-20 MED ORDER — PROTAMINE SULFATE 10 MG/ML IV SOLN
INTRAVENOUS | Status: DC | PRN
  Administered 2018-03-20: 20 mg via INTRAVENOUS

## 2018-03-20 MED ORDER — DOCUSATE SODIUM 100 MG PO CAPS
200.0000 mg | ORAL_CAPSULE | Freq: Every day | ORAL | Status: DC
  Administered 2018-03-21 – 2018-03-23 (×3): 200 mg via ORAL
  Filled 2018-03-20 (×3): qty 2

## 2018-03-20 MED ORDER — 0.9 % SODIUM CHLORIDE (POUR BTL) OPTIME
TOPICAL | Status: DC | PRN
  Administered 2018-03-20: 6000 mL

## 2018-03-20 MED ORDER — ALBUMIN HUMAN 5 % IV SOLN
250.0000 mL | INTRAVENOUS | Status: AC | PRN
  Administered 2018-03-20 – 2018-03-21 (×3): 250 mL via INTRAVENOUS
  Filled 2018-03-20: qty 250

## 2018-03-20 MED ORDER — SODIUM CHLORIDE 0.9 % IV SOLN
INTRAVENOUS | Status: DC | PRN
  Administered 2018-03-20: 1250 mg via INTRAVENOUS

## 2018-03-20 MED ORDER — SODIUM CHLORIDE 0.9% FLUSH: 3.0000 mL | INTRAVENOUS | Status: DC | PRN

## 2018-03-20 MED ORDER — CHLORHEXIDINE GLUCONATE 0.12 % MT SOLN
15.0000 mL | OROMUCOSAL | Status: AC
  Administered 2018-03-20: 15 mL via OROMUCOSAL

## 2018-03-20 MED ORDER — ASPIRIN 81 MG PO CHEW: 324.0000 mg | CHEWABLE_TABLET | Freq: Every day | ORAL | Status: DC

## 2018-03-20 MED ORDER — ACETAMINOPHEN 160 MG/5ML PO SOLN: 650.0000 mg | Freq: Once | ORAL | Status: AC

## 2018-03-20 MED ORDER — MIDAZOLAM HCL 2 MG/2ML IJ SOLN: 2.0000 mg | INTRAMUSCULAR | Status: DC | PRN

## 2018-03-20 MED ORDER — METOPROLOL TARTRATE 5 MG/5ML IV SOLN
2.5000 mg | INTRAVENOUS | Status: DC | PRN
  Administered 2018-03-22: 2.5 mg via INTRAVENOUS
  Filled 2018-03-20: qty 5

## 2018-03-20 MED ORDER — CHLORHEXIDINE GLUCONATE 0.12 % MT SOLN
OROMUCOSAL | Status: AC
  Administered 2018-03-20: 15 mL via OROMUCOSAL
  Filled 2018-03-20: qty 15

## 2018-03-20 MED ORDER — ROCURONIUM BROMIDE 10 MG/ML (PF) SYRINGE
PREFILLED_SYRINGE | INTRAVENOUS | Status: AC
  Filled 2018-03-20: qty 5

## 2018-03-20 MED ORDER — OXYCODONE HCL 5 MG PO TABS
5.0000 mg | ORAL_TABLET | ORAL | Status: DC | PRN
  Administered 2018-03-21 (×2): 10 mg via ORAL
  Filled 2018-03-20 (×2): qty 2

## 2018-03-20 MED ORDER — LEVOTHYROXINE SODIUM 100 MCG PO TABS
100.0000 ug | ORAL_TABLET | Freq: Every day | ORAL | Status: DC
  Administered 2018-03-21 – 2018-03-24 (×4): 100 ug via ORAL
  Filled 2018-03-20 (×4): qty 1

## 2018-03-20 MED ORDER — MIDAZOLAM HCL 10 MG/2ML IJ SOLN
INTRAMUSCULAR | Status: AC
  Filled 2018-03-20: qty 2

## 2018-03-20 MED ORDER — FAMOTIDINE IN NACL 20-0.9 MG/50ML-% IV SOLN
20.0000 mg | Freq: Two times a day (BID) | INTRAVENOUS | Status: DC
  Administered 2018-03-20: 20 mg via INTRAVENOUS

## 2018-03-20 MED ORDER — PROPOFOL 10 MG/ML IV BOLUS
INTRAVENOUS | Status: DC | PRN
  Administered 2018-03-20: 30 mg via INTRAVENOUS

## 2018-03-20 MED ORDER — SODIUM CHLORIDE 0.9 % IV SOLN: INTRAVENOUS | Status: DC

## 2018-03-20 SURGICAL SUPPLY — 96 items
ADAPTER CARDIO PERF ANTE/RETRO (ADAPTER) ×3 IMPLANT
ADPR PRFSN 84XANTGRD RTRGD (ADAPTER) ×2
BLADE STERNUM SYSTEM 6 (BLADE) ×3 IMPLANT
BLADE SURG 11 STRL SS (BLADE) ×3 IMPLANT
CANISTER SUCT 3000ML PPV (MISCELLANEOUS) ×3 IMPLANT
CANNULA AORTIC ROOT 9FR (CANNULA) ×1 IMPLANT
CANNULA EZ GLIDE AORTIC 21FR (CANNULA) ×3 IMPLANT
CANNULA GUNDRY RCSP 15FR (MISCELLANEOUS) ×3 IMPLANT
CANNULA MC2 2 STG 36/46 NON-V (CANNULA) IMPLANT
CANNULA SOFTFLOW AORTIC 7M21FR (CANNULA) ×3 IMPLANT
CANNULA VENOUS 2 STG 34/46 (CANNULA) ×1
CATH CPB KIT OWEN (MISCELLANEOUS) ×2 IMPLANT
CATH HEART VENT LEFT (CATHETERS) ×2 IMPLANT
CATH THORACIC 36FR (CATHETERS) ×1 IMPLANT
CATH THORACIC 36FR RT ANG (CATHETERS) ×2 IMPLANT
CONN ST 1/4X3/8  BEN (MISCELLANEOUS) ×1
CONN ST 1/4X3/8 BEN (MISCELLANEOUS) IMPLANT
CONT SPEC 4OZ CLIKSEAL STRL BL (MISCELLANEOUS) ×1 IMPLANT
COVER SURGICAL LIGHT HANDLE (MISCELLANEOUS) ×2 IMPLANT
CRADLE DONUT ADULT HEAD (MISCELLANEOUS) ×3 IMPLANT
DRAIN CHANNEL 32F RND 10.7 FF (WOUND CARE) ×4 IMPLANT
DRAPE BILATERAL SPLIT (DRAPES) IMPLANT
DRAPE CARDIOVASCULAR INCISE (DRAPES) ×3
DRAPE CV SPLIT W-CLR ANES SCRN (DRAPES) IMPLANT
DRAPE INCISE IOBAN 66X45 STRL (DRAPES) ×5 IMPLANT
DRAPE SLUSH MACHINE 52X66 (DRAPES) ×1 IMPLANT
DRAPE SLUSH/WARMER DISC (DRAPES) ×2 IMPLANT
DRAPE SRG 135X102X78XABS (DRAPES) IMPLANT
DRSG AQUACEL AG ADV 3.5X14 (GAUZE/BANDAGES/DRESSINGS) ×1 IMPLANT
DRSG COVADERM 4X14 (GAUZE/BANDAGES/DRESSINGS) ×2 IMPLANT
ELECT REM PT RETURN 9FT ADLT (ELECTROSURGICAL) ×6
ELECTRODE REM PT RTRN 9FT ADLT (ELECTROSURGICAL) ×4 IMPLANT
FELT TEFLON 1X6 (MISCELLANEOUS) ×5 IMPLANT
GAUZE SPONGE 4X4 12PLY STRL (GAUZE/BANDAGES/DRESSINGS) ×5 IMPLANT
GAUZE SPONGE 4X4 12PLY STRL LF (GAUZE/BANDAGES/DRESSINGS) ×1 IMPLANT
GLOVE BIO SURGEON STRL SZ 6 (GLOVE) IMPLANT
GLOVE BIO SURGEON STRL SZ 6.5 (GLOVE) ×2 IMPLANT
GLOVE BIO SURGEON STRL SZ7 (GLOVE) IMPLANT
GLOVE BIO SURGEON STRL SZ7.5 (GLOVE) IMPLANT
GLOVE BIOGEL PI IND STRL 6 (GLOVE) IMPLANT
GLOVE BIOGEL PI IND STRL 6.5 (GLOVE) IMPLANT
GLOVE BIOGEL PI INDICATOR 6 (GLOVE) ×1
GLOVE BIOGEL PI INDICATOR 6.5 (GLOVE) ×3
GLOVE ORTHO TXT STRL SZ7.5 (GLOVE) ×8 IMPLANT
GOWN STRL REUS W/ TWL LRG LVL3 (GOWN DISPOSABLE) ×8 IMPLANT
GOWN STRL REUS W/TWL LRG LVL3 (GOWN DISPOSABLE) ×21
HEMOSTAT POWDER SURGIFOAM 1G (HEMOSTASIS) ×9 IMPLANT
INSERT FOGARTY XLG (MISCELLANEOUS) ×3 IMPLANT
KIT BASIN OR (CUSTOM PROCEDURE TRAY) ×3 IMPLANT
KIT ROOM TURNOVER OR (KITS) ×3 IMPLANT
KIT SUCTION CATH 14FR (SUCTIONS) ×10 IMPLANT
KIT SUT CK MINI COMBO 4X17 (Prosthesis & Implant Heart) ×1 IMPLANT
LEAD PACING MYOCARDI (MISCELLANEOUS) ×3 IMPLANT
LINE VENT (MISCELLANEOUS) ×1 IMPLANT
NS IRRIG 1000ML POUR BTL (IV SOLUTION) ×16 IMPLANT
PACK E OPEN HEART (SUTURE) ×3 IMPLANT
PACK OPEN HEART (CUSTOM PROCEDURE TRAY) ×3 IMPLANT
PAD ARMBOARD 7.5X6 YLW CONV (MISCELLANEOUS) ×6 IMPLANT
SET CARDIOPLEGIA MPS 5001102 (MISCELLANEOUS) ×1 IMPLANT
SET IRRIG TUBING LAPAROSCOPIC (IRRIGATION / IRRIGATOR) ×2 IMPLANT
SUT BONE WAX W31G (SUTURE) ×3 IMPLANT
SUT ETHIBON 2 0 V 52N 30 (SUTURE) ×4 IMPLANT
SUT ETHIBON EXCEL 2-0 V-5 (SUTURE) IMPLANT
SUT ETHIBOND 2 0 SH (SUTURE)
SUT ETHIBOND 2 0 SH 36X2 (SUTURE) IMPLANT
SUT ETHIBOND 2 0 V4 (SUTURE) IMPLANT
SUT ETHIBOND 2 0V4 GREEN (SUTURE) IMPLANT
SUT ETHIBOND 4 0 RB 1 (SUTURE) IMPLANT
SUT ETHIBOND V-5 VALVE (SUTURE) IMPLANT
SUT ETHIBOND X763 2 0 SH 1 (SUTURE) ×15 IMPLANT
SUT MNCRL AB 3-0 PS2 18 (SUTURE) ×6 IMPLANT
SUT PDS AB 1 CTX 36 (SUTURE) ×6 IMPLANT
SUT PROLENE 3 0 SH1 36 (SUTURE) ×2 IMPLANT
SUT PROLENE 4 0 RB 1 (SUTURE) ×15
SUT PROLENE 4 0 SH DA (SUTURE) ×4 IMPLANT
SUT PROLENE 4-0 RB1 .5 CRCL 36 (SUTURE) ×4 IMPLANT
SUT PROLENE 5 0 C 1 36 (SUTURE) IMPLANT
SUT PROLENE 6 0 C 1 30 (SUTURE) IMPLANT
SUT SILK  1 MH (SUTURE) ×1
SUT SILK 1 MH (SUTURE) ×2 IMPLANT
SUT SILK 2 0 SH CR/8 (SUTURE) IMPLANT
SUT SILK 3 0 SH CR/8 (SUTURE) IMPLANT
SUT STEEL 6MS V (SUTURE) IMPLANT
SUT STEEL SZ 6 DBL 3X14 BALL (SUTURE) ×4 IMPLANT
SUT VIC AB 2-0 CTX 27 (SUTURE) IMPLANT
SYSTEM SAHARA CHEST DRAIN ATS (WOUND CARE) ×3 IMPLANT
TAPE CLOTH SURG 4X10 WHT LF (GAUZE/BANDAGES/DRESSINGS) ×1 IMPLANT
TAPE PAPER 2X10 WHT MICROPORE (GAUZE/BANDAGES/DRESSINGS) ×1 IMPLANT
TOWEL GREEN STERILE (TOWEL DISPOSABLE) ×3 IMPLANT
TOWEL GREEN STERILE FF (TOWEL DISPOSABLE) ×3 IMPLANT
TRAY FOLEY SILVER 16FR TEMP (SET/KITS/TRAYS/PACK) ×3 IMPLANT
TUBE SUCT INTRACARD DLP 20F (MISCELLANEOUS) ×1 IMPLANT
UNDERPAD 30X30 (UNDERPADS AND DIAPERS) ×3 IMPLANT
VALVE SYSTEM EDWS INTUITY 23A (Prosthesis & Implant Heart) ×1 IMPLANT
VENT LEFT HEART 12002 (CATHETERS) ×3
WATER STERILE IRR 1000ML POUR (IV SOLUTION) ×6 IMPLANT

## 2018-03-20 NOTE — Anesthesia Postprocedure Evaluation (Signed)
Anesthesia Post Note  Patient: Cheyenne Jones  Procedure(s) Performed: AORTIC VALVE REPLACEMENT (AVR) (N/A Chest) TRANSESOPHAGEAL ECHOCARDIOGRAM (TEE) (N/A )     Patient location during evaluation: SICU Anesthesia Type: General Level of consciousness: awake and alert, oriented and patient cooperative Pain management: pain level controlled Vital Signs Assessment: post-procedure vital signs reviewed and stable Respiratory status: spontaneous breathing, nonlabored ventilation, respiratory function stable and patient connected to nasal cannula oxygen Cardiovascular status: blood pressure returned to baseline and stable (off of pressors) Postop Assessment: no apparent nausea or vomiting (mild sore throat, dry mouth) Anesthetic complications: no    Last Vitals:  Vitals:   03/20/18 1500 03/20/18 1530  BP: (!) 111/56 (!) 107/51  Pulse: 64 68  Resp: 18 (!) 21  Temp: 36.5 C 36.5 C  SpO2: 98% 100%    Last Pain:  Vitals:   03/20/18 1215  TempSrc: Core (Comment)                 Ibraham Levi,E. Yarlin Breisch

## 2018-03-20 NOTE — Anesthesia Procedure Notes (Signed)
Central Venous Catheter Insertion Performed by: Roberts Gaudy, MD, anesthesiologist Start/End3/22/2019 6:55 AM, 03/20/2018 7:00 AM Patient location: Pre-op. Preanesthetic checklist: patient identified, IV checked, site marked, risks and benefits discussed, surgical consent, monitors and equipment checked, pre-op evaluation, timeout performed and anesthesia consent Hand hygiene performed  and maximum sterile barriers used  Catheter size: 8.5 Fr PA cath was placed.Swan type:thermodilution Procedure performed without using ultrasound guided technique. Attempts: 1 Patient tolerated the procedure well with no immediate complications.

## 2018-03-20 NOTE — Plan of Care (Signed)
  Problem: Activity: Goal: Risk for activity intolerance will decrease Outcome: Progressing Note:  Pt dangled at bedside after extubation and tolerated it well.    Problem: Respiratory: Goal: Respiratory status will improve Outcome: Progressing Note:  Pt extubated within six hour window and is tolerating 2L [place] well.    Problem: Urinary Elimination: Goal: Ability to achieve and maintain adequate renal perfusion and functioning will improve Outcome: Progressing Note:  Pt is maintaining adequate UOP in the immediate postoperative period

## 2018-03-20 NOTE — Anesthesia Procedure Notes (Signed)
Central Venous Catheter Insertion Performed by: Roberts Gaudy, MD, anesthesiologist Start/End3/22/2019 6:55 AM, 03/20/2018 7:00 AM Patient location: Pre-op. Preanesthetic checklist: patient identified, IV checked, site marked, risks and benefits discussed, surgical consent, monitors and equipment checked, pre-op evaluation, timeout performed and anesthesia consent Lidocaine 1% used for infiltration and patient sedated Hand hygiene performed  and maximum sterile barriers used  Catheter size: 8.5 Fr Sheath introducer Procedure performed using ultrasound guided technique. Ultrasound Notes:anatomy identified, needle tip was noted to be adjacent to the nerve/plexus identified, no ultrasound evidence of intravascular and/or intraneural injection and image(s) printed for medical record Attempts: 1 Following insertion, line sutured and dressing applied. Post procedure assessment: blood return through all ports, free fluid flow and no air  Patient tolerated the procedure well with no immediate complications.

## 2018-03-20 NOTE — Anesthesia Procedure Notes (Signed)
Arterial Line Insertion Start/End3/22/2019 7:07 AM Performed by: Clearnce Sorrel, CRNA, CRNA  Patient location: Pre-op. Preanesthetic checklist: patient identified, IV checked, site marked, risks and benefits discussed, surgical consent, monitors and equipment checked, pre-op evaluation, timeout performed and anesthesia consent Lidocaine 1% used for infiltration and patient sedated radial was placed Catheter size: 20 G Hand hygiene performed  and maximum sterile barriers used   Attempts: 2 Procedure performed without using ultrasound guided technique. Following insertion, Biopatch and dressing applied. Post procedure assessment: normal  Patient tolerated the procedure well with no immediate complications.

## 2018-03-20 NOTE — Anesthesia Procedure Notes (Signed)
Procedure Name: Intubation Date/Time: 08/13/2018 8:34 AM Performed by: Clearnce Sorrel, CRNA Pre-anesthesia Checklist: Patient identified, Emergency Drugs available, Suction available and Patient being monitored Patient Re-evaluated:Patient Re-evaluated prior to induction Oxygen Delivery Method: Circle System Utilized Preoxygenation: Pre-oxygenation with 100% oxygen Induction Type: IV induction Ventilation: Mask ventilation without difficulty and Oral airway inserted - appropriate to patient size Laryngoscope Size: Mac and 3 Grade View: Grade I Tube type: Oral Tube size: 8.0 mm Number of attempts: 1 Airway Equipment and Method: Stylet and Oral airway Placement Confirmation: ETT inserted through vocal cords under direct vision,  positive ETCO2 and breath sounds checked- equal and bilateral Secured at: 23 cm Tube secured with: Tape Dental Injury: Teeth and Oropharynx as per pre-operative assessment

## 2018-03-20 NOTE — Procedures (Signed)
Extubation Procedure Note  Patient Details:   Name: ALABAMA DOIG DOB: 02/28/1941 MRN: [id number]   Airway Documentation:   Pt extubated per protocol. Patient had VC and NIF within normal range. Patient also had positive cuff leak prior to extubation. Patient placed on 4lpm humidified o2. Incentive spirometry instructed with teach back. 750 cc achieved x6.  Evaluation  O2 sats: stable throughout Complications: No apparent complications Patient did tolerate procedure well. Bilateral Breath Sounds: Clear, Diminished   Yes  Ander Purpura 03/20/2018, 3:28 PM

## 2018-03-20 NOTE — Brief Op Note (Signed)
03/20/2018  11:36 AM  PATIENT:  Cheyenne Jones  77 y.o. female  PRE-OPERATIVE DIAGNOSIS:  AS  POST-OPERATIVE DIAGNOSIS:  AS  PROCEDURE:  Procedure(s): AORTIC VALVE REPLACEMENT (AVR) (N/A) TRANSESOPHAGEAL ECHOCARDIOGRAM (TEE) (N/A)  SURGEON:  Surgeon(s) and Role:    Rexene Alberts, MD - Primary  PHYSICIAN ASSISTANT: WAYNE GOLD PA-C  ANESTHESIA:   general  EBL:  850 mL  BLOOD ADMINISTERED:none  DRAINS: PLEURAL AND PERICARDIAL CHEST DRAINS   LOCAL MEDICATIONS USED:  NONE  SPECIMEN:  Source of Specimen:  AORTIC VALVE LEAFLETS  DISPOSITION OF SPECIMEN:  PATHOLOGY  COUNTS:  YES  TOURNIQUET:  * No tourniquets in log *  DICTATION: .Dragon Dictation  PLAN OF CARE: Admit to inpatient   PATIENT DISPOSITION:  ICU - intubated and hemodynamically stable.   Delay start of Pharmacological VTE agent (>24hrs) due to surgical blood loss or risk of bleeding: yes

## 2018-03-20 NOTE — Progress Notes (Signed)
[facility] BRIEF SICU PROGRESS NOTE  Day of Surgery  S/P Procedure(s) (LRB): AORTIC VALVE REPLACEMENT (AVR) (N/A) TRANSESOPHAGEAL ECHOCARDIOGRAM (TEE) (N/A)   Doing well Extubated uneventfully NSR w/ stable hemodynamics, no drips Breathing comfortably w/ O2 sats 98% on 4 L/min Chest tube output low UOP excellent Labs okay  Plan: Continue routine early postop  Rexene Alberts, MD 03/20/2018 6:37 PM

## 2018-03-20 NOTE — Op Note (Signed)
CARDIOTHORACIC SURGERY OPERATIVE NOTE  Date of Procedure:  03/20/2018  Preoperative Diagnosis: Severe Aortic Stenosis   Postoperative Diagnosis: Same   Procedure:    Aortic Valve Replacement  Edwards Intuity Elite rapid deployment bovine pericardial tissue valve (size 23 mm, model # 8300AB, serial # [id number])   Surgeon: Valentina Gu. Roxy Manns, MD  Assistant: John Giovanni, PA-C  Anesthesia: Midge Minium, MD  Operative Findings:  Severe aortic stenosis  Normal left ventricular systolic function         BRIEF CLINICAL NOTE AND INDICATIONS FOR SURGERY  Patient is a 77 year old female with history of aortic stenosis, hypertension, hypercholesterolemia, asthmatic bronchitis, and degenerative joint disease who has been referred for surgical consultation to discuss treatment options for management of severe symptomatic aortic stenosis. The patient has been physically active and healthy most of her adult life. She was noted to have a heart murmur on physical exam and diagnosed with aortic stenosis several years ago. She has been followed carefully for the past several years by Dr. Johnsie Cancel. Serial echocardiograms demonstrated progression in disease, and recently the patient has begun to experience increased fatigue with decreased exercise tolerance and mild exertional shortness of breath. Follow-up echocardiogram performed January 01, 2018 revealed severe aortic stenosis. Peak velocity across the aortic valve measured 4.6 m/s corresponding to mean transvalvular gradient estimated 52 mmHg. The DVI was quite low at 0.19. Left ventricular systolic function remain normal at 60%. The patient subsequently underwent left and right heart catheterization on February 16, 2018. She was found to have normal coronary artery anatomy with no significant coronary artery disease. Mean transvalvular gradient across the aortic valve was measured 38 mmHg with aortic valve area calculated 0.72 cm.  Pulmonary artery pressures were normal. The patient was referred for elective surgical consultation.  The patient has been seen in consultation and counseled at length regarding the indications, risks and potential benefits of surgery.  All questions have been answered, and the patient provides full informed consent for the operation as described.    DETAILS OF THE OPERATIVE PROCEDURE  Preparation:  The patient is brought to the operating room on the above mentioned date and central monitoring was established by the anesthesia team including placement of Swan-Ganz catheter and radial arterial line. The patient is placed in the supine position on the operating table.  Intravenous antibiotics are administered. General endotracheal anesthesia is induced uneventfully. A Foley catheter is placed.  Baseline transesophageal echocardiogram was performed.  Findings were notable for severe aortic stenosis.  The aortic valve is trileaflet.  There was severe thickening, calcification, and restricted leaflet mobility involving all 3 leaflets.  There was normal left ventricular systolic function.  The patient's chest, abdomen, both groins, and both lower extremities are prepared and draped in a sterile manner. A time out procedure is performed.   Surgical Approach:  A median sternotomy incision was performed and the pericardium is opened. The ascending aorta is normal in appearance.    Extracorporeal Cardiopulmonary Bypass and Myocardial Protection:  The ascending aorta and the right atrium are cannulated for cardiopulmonary bypass.  Adequate heparinization is verified.   A retrograde cardioplegia cannula is placed through the right atrium into the coronary sinus.  The operative field was continuously flooded with carbon dioxide gas.  The entire pre-bypass portion of the operation was notable for stable hemodynamics.  Cardiopulmonary bypass was begun and the surface of the heart is inspected.  A left  ventricular vent is placed through the right superior pulmonary vein.  A cardioplegia cannula is placed in the ascending aorta.  A temperature probe was placed in the interventricular septum.  The patient is allowed to cool passively to [facility] systemic temperature.  The aortic cross clamp is applied and cardioplegia is delivered initially in an antegrade fashion through the aortic root using modified [place] cold blood cardioplegia (KBC protocol).   The initial cardioplegic arrest is rapid with early diastolic arrest.  Approximately 1000 mL of the initial arresting dose is administered antegrade through the aortic root, and the remainder is administered retrograde through the coronary sinus catheter.   Aortic Valve Replacement:  An oblique transverse aortotomy incision was performed.  The aortic valve was inspected and notable for severe aortic stenosis.  The aortic valve leaflets were excised sharply and the aortic annulus decalcified.  Decalcification was notably straightforward.  The aortic annulus was sized to accept a 23 mm prosthesis.  The aortic root and left ventricle were irrigated with copious cold saline solution.  Aortic valve replacement was performed using an [employer] rapid deployment pericardial tissue valve (size 23 mm, model #8300AB, serial # [id number]).  The valve was rinsed in saline for manufacture recommendations. A total of 4 individual guiding sutures are placed through the aortic annulus, each at the corresponding nadir of each sinus of Valsalva with two in the non-coronary sinus of Valsalva.  The valve was attached to the delivery device and the 4 guiding sutures placed through the valve sewing cuff. The valve is lowered into place. Care is taken to insure that the valve is completely seated in the annulus. Each of the guide sutures are secured using Cor knot clips.  The valve stent is deployed by inflating the deployment balloon to 4.5 atm pressure and holding for 10  seconds. The balloon is deflated the valve holder sutures cut In the deployment system removed.   The valve is carefully inspected to make sure that it is seated appropriately. Endoscopic visualization through the valve is utilized to inspect the left ventricular outflow tract. Aortic root was filled with saline to make sure the valve is competent. Rewarming is begun.   Procedure Completion:  The aortotomy was closed using a 2-layer closure of running 4-0 Prolene suture.  One final dose of warm retrograde "reanimation dose" cardioplegia was administered retrograde through the coronary sinus catheter while all air was evacuated through the aortic root.  The aortic cross clamp was removed after a total cross clamp time of 49 minutes.  Epicardial pacing wires are fixed to the right ventricular outflow tract and to the right atrial appendage. The patient is rewarmed to 37C temperature. The aortic and left ventricular vents are removed.  The patient is weaned and disconnected from cardiopulmonary bypass.  The patient's rhythm at separation from bypass was sinus.  The patient was weaned from cardiopulmonary bypass without any inotropic support. Total cardiopulmonary bypass time for the operation was 70 minutes.  Followup transesophageal echocardiogram performed after separation from bypass revealed a well-seated aortic valve prosthesis that was functioning normally and without any sign of perivalvular leak.  Deep transgastric views are obtained from multiple angles to make certain the valve is well-seated.  Mean transvalvular gradient across the valve is estimated 5 mmHg.  Left ventricular function was unchanged from preoperatively.  The aortic and venous cannula were removed uneventfully. Protamine was administered to reverse the anticoagulation. The mediastinum and pleural space were inspected for hemostasis and irrigated with saline solution. The mediastinum and the right pleural space were drained  using 3  chest tubes placed through separate stab incisions inferiorly.  The soft tissues anterior to the aorta were reapproximated loosely. The sternum is closed with double strength sternal wire. The soft tissues anterior to the sternum were closed in multiple layers and the skin is closed with a running subcuticular skin closure.  The post-bypass portion of the operation was notable for stable rhythm and hemodynamics.  No blood products were administered during the operation.   Disposition:  The patient tolerated the procedure well and is transported to the surgical intensive care in stable condition. There are no intraoperative complications. All sponge instrument and needle counts are verified correct at completion of the operation.    Valentina Gu. Roxy Manns MD 03/20/2018 11:39 AM

## 2018-03-20 NOTE — Interval H&P Note (Signed)
History and Physical Interval Note:  03/20/2018 6:56 AM  Cheyenne Jones  has presented today for surgery, with the diagnosis of AS  The various methods of treatment have been discussed with the patient and family. After consideration of risks, benefits and other options for treatment, the patient has consented to  Procedure(s): AORTIC VALVE REPLACEMENT (AVR) (N/A) TRANSESOPHAGEAL ECHOCARDIOGRAM (TEE) (N/A) as a surgical intervention .  The patient's history has been reviewed, patient examined, no change in status, stable for surgery.  I have reviewed the patient's chart and labs.  Questions were answered to the patient's satisfaction.     Rexene Alberts

## 2018-03-20 NOTE — Transfer of Care (Signed)
Immediate Anesthesia Transfer of Care Note  Patient: Cheyenne Jones  Procedure(s) Performed: AORTIC VALVE REPLACEMENT (AVR) (N/A Chest) TRANSESOPHAGEAL ECHOCARDIOGRAM (TEE) (N/A )  Patient Location: SICU  Anesthesia Type:General  Level of Consciousness: Patient remains intubated per anesthesia plan  Airway & Oxygen Therapy: Patient remains intubated per anesthesia plan  Post-op Assessment: Report given to RN and Post -op Vital signs reviewed and stable  Post vital signs: Reviewed and stable  Last Vitals:  Vitals Value Taken Time  BP 131/55 03/20/2018 12:09 PM  Temp 35.5 C 03/20/2018 12:13 PM  Pulse 70 03/20/2018 12:13 PM  Resp 14 03/20/2018 12:13 PM  SpO2 96 % 03/20/2018 12:13 PM  Vitals shown include unvalidated device data.  Last Pain:  Vitals:   03/20/18 0542  TempSrc: Oral      Patients Stated Pain Goal: 4 (38/93/73 [id number])  Complications: No apparent anesthesia complications

## 2018-03-21 ENCOUNTER — Inpatient Hospital Stay (HOSPITAL_COMMUNITY): Payer: Medicare HMO

## 2018-03-21 LAB — CBC
HCT: 26.5 % — ABNORMAL LOW (ref 36.0–46.0)
HCT: 29.2 % — ABNORMAL LOW (ref 36.0–46.0)
Hemoglobin: 8.7 g/dL — ABNORMAL LOW (ref 12.0–15.0)
Hemoglobin: 9.6 g/dL — ABNORMAL LOW (ref 12.0–15.0)
MCH: 31 pg (ref 26.0–34.0)
MCH: 31.5 pg (ref 26.0–34.0)
MCHC: 32.8 g/dL (ref 30.0–36.0)
MCHC: 32.9 g/dL (ref 30.0–36.0)
MCV: 94.3 fL (ref 78.0–100.0)
MCV: 95.7 fL (ref 78.0–100.0)
PLATELETS: 137 10*3/uL — AB (ref 150–400)
Platelets: 154 10*3/uL (ref 150–400)
RBC: 2.81 MIL/uL — AB (ref 3.87–5.11)
RBC: 3.05 MIL/uL — ABNORMAL LOW (ref 3.87–5.11)
RDW: 13.7 % (ref 11.5–15.5)
RDW: 14.2 % (ref 11.5–15.5)
WBC: 11.9 10*3/uL — AB (ref 4.0–10.5)
WBC: 13 10*3/uL — AB (ref 4.0–10.5)

## 2018-03-21 LAB — MAGNESIUM
MAGNESIUM: 2.2 mg/dL (ref 1.7–2.4)
Magnesium: 2.5 mg/dL — ABNORMAL HIGH (ref 1.7–2.4)

## 2018-03-21 LAB — BLOOD GAS, ARTERIAL
Acid-base deficit: 0.6 mmol/L (ref 0.0–2.0)
BICARBONATE: 23.4 mmol/L (ref 20.0–28.0)
DRAWN BY: 51702
O2 CONTENT: 28 L/min
O2 Saturation: 95.3 %
PATIENT TEMPERATURE: 98.6
pCO2 arterial: 37.6 mmHg (ref 32.0–48.0)
pH, Arterial: 7.411 (ref 7.350–7.450)
pO2, Arterial: 75.5 mmHg — ABNORMAL LOW (ref 83.0–108.0)

## 2018-03-21 LAB — GLUCOSE, CAPILLARY
GLUCOSE-CAPILLARY: 150 mg/dL — AB (ref 65–99)
Glucose-Capillary: 100 mg/dL — ABNORMAL HIGH (ref 65–99)
Glucose-Capillary: 110 mg/dL — ABNORMAL HIGH (ref 65–99)
Glucose-Capillary: 113 mg/dL — ABNORMAL HIGH (ref 65–99)
Glucose-Capillary: 115 mg/dL — ABNORMAL HIGH (ref 65–99)
Glucose-Capillary: 116 mg/dL — ABNORMAL HIGH (ref 65–99)

## 2018-03-21 LAB — POCT I-STAT, CHEM 8
BUN: 11 mg/dL (ref 6–20)
CALCIUM ION: 1.25 mmol/L (ref 1.15–1.40)
Chloride: 101 mmol/L (ref 101–111)
Creatinine, Ser: 0.6 mg/dL (ref 0.44–1.00)
GLUCOSE: 128 mg/dL — AB (ref 65–99)
HCT: 27 % — ABNORMAL LOW (ref 36.0–46.0)
Hemoglobin: 9.2 g/dL — ABNORMAL LOW (ref 12.0–15.0)
Potassium: 3.7 mmol/L (ref 3.5–5.1)
SODIUM: 137 mmol/L (ref 135–145)
TCO2: 24 mmol/L (ref 22–32)

## 2018-03-21 LAB — BASIC METABOLIC PANEL
ANION GAP: 7 (ref 5–15)
BUN: 9 mg/dL (ref 6–20)
CO2: 22 mmol/L (ref 22–32)
Calcium: 8 mg/dL — ABNORMAL LOW (ref 8.9–10.3)
Chloride: 108 mmol/L (ref 101–111)
Creatinine, Ser: 0.52 mg/dL (ref 0.44–1.00)
Glucose, Bld: 119 mg/dL — ABNORMAL HIGH (ref 65–99)
POTASSIUM: 3.4 mmol/L — AB (ref 3.5–5.1)
SODIUM: 137 mmol/L (ref 135–145)

## 2018-03-21 LAB — CREATININE, SERUM
CREATININE: 0.57 mg/dL (ref 0.44–1.00)
GFR calc Af Amer: >60 mL/min (ref >60)

## 2018-03-21 MED ORDER — POLYSACCHARIDE IRON COMPLEX 150 MG PO CAPS
150.0000 mg | ORAL_CAPSULE | Freq: Every day | ORAL | Status: DC
  Administered 2018-03-22 – 2018-03-24 (×3): 150 mg via ORAL
  Filled 2018-03-21 (×3): qty 1

## 2018-03-21 MED ORDER — POTASSIUM CHLORIDE 10 MEQ/50ML IV SOLN
10.0000 meq | INTRAVENOUS | Status: AC | PRN
  Administered 2018-03-21 (×3): 10 meq via INTRAVENOUS
  Filled 2018-03-21 (×3): qty 50

## 2018-03-21 MED ORDER — FA-PYRIDOXINE-CYANOCOBALAMIN 2.5-25-2 MG PO TABS
1.0000 | ORAL_TABLET | Freq: Every day | ORAL | Status: DC
  Administered 2018-03-21 – 2018-03-24 (×4): 1 via ORAL
  Filled 2018-03-21 (×5): qty 1

## 2018-03-21 MED ORDER — FUROSEMIDE 10 MG/ML IJ SOLN
20.0000 mg | Freq: Once | INTRAMUSCULAR | Status: AC
  Administered 2018-03-21: 20 mg via INTRAVENOUS
  Filled 2018-03-21: qty 2

## 2018-03-21 MED ORDER — ENOXAPARIN SODIUM 40 MG/0.4ML ~~LOC~~ SOLN
40.0000 mg | Freq: Every day | SUBCUTANEOUS | Status: DC
  Administered 2018-03-22 – 2018-03-23 (×2): 40 mg via SUBCUTANEOUS
  Filled 2018-03-21 (×2): qty 0.4

## 2018-03-21 MED ORDER — POTASSIUM CHLORIDE 10 MEQ/50ML IV SOLN: 10.0000 meq | INTRAVENOUS | Status: DC | PRN

## 2018-03-21 MED ORDER — POTASSIUM CHLORIDE 10 MEQ/50ML IV SOLN
10.0000 meq | INTRAVENOUS | Status: AC
  Administered 2018-03-21 (×3): 10 meq via INTRAVENOUS
  Filled 2018-03-21 (×3): qty 50

## 2018-03-21 NOTE — Progress Notes (Signed)
[address]       [place] [address]             [phone]        CARDIOTHORACIC SURGERY PROGRESS NOTE   R1 Day Post-Op Procedure(s) (LRB): AORTIC VALVE REPLACEMENT (AVR) (N/A) TRANSESOPHAGEAL ECHOCARDIOGRAM (TEE) (N/A)  Subjective: Looks good.  Mild soreness in chest and back.  No SOB.  No nausea  Objective: Vital signs: BP Readings from Last 1 Encounters:  03/21/18 (!) 133/58   Pulse Readings from Last 1 Encounters:  03/21/18 80   Resp Readings from Last 1 Encounters:  03/21/18 19   Temp Readings from Last 1 Encounters:  03/21/18 99.5 F (37.5 C)    Hemodynamics: PAP: (14-35)/(1-14) 32/11 CO:  [3.6 L/min-6 L/min] 6 L/min CI:  [2.1 L/min/m2-3.6 L/min/m2] 3.6 L/min/m2  Physical Exam:  Rhythm:   sinus  Breath sounds: clear  Heart sounds:  RRR  Incisions:  Dressings dry, intact  Abdomen:  Soft, non-distended, non-tender  Extremities:  Warm, well-perfused  Chest tubes:  low volume thin serosanguinous output, no air leak    Intake/Output from previous day: 03/22 0701 - 03/23 0700 In: 6328.9 [I.V.:3968.9; Blood:410; IV Piggyback:1950] Out: 3212 [Urine:1840; Blood:850; Chest Tube:522] Intake/Output this shift: Total I/O In: -  Out: 110 [Urine:50; Chest Tube:60]  Lab Results:  CBC: Recent Labs    03/20/18 1800 03/21/18 0329  WBC 11.8* 11.9*  HGB 9.4* 8.7*  HCT 29.0* 26.5*  PLT 139* 137*    BMET:  Recent Labs    03/18/18 1235  03/20/18 1749 03/20/18 1800 03/21/18 0329  NA 138   < > 144  --  137  K 3.4*   < > 3.8  --  3.4*  CL 102   < > 107  --  108  CO2 27  --   --   --  22  GLUCOSE 93   < > 115*  --  119*  BUN 12   < > 11  --  9  CREATININE 0.45   < > 0.40* 0.55 0.52  CALCIUM 9.5  --   --   --  8.0*   < > = values in this interval not displayed.     PT/INR:   Recent Labs    03/20/18 1204  LABPROT 18.6*  INR 1.57    CBG (last 3)  Recent Labs    03/20/18 2007 03/21/18 0449 03/21/18 0743  GLUCAP 107*  100* 116*    ABG    Component Value Date/Time   PHART 7.411 03/21/2018 0400   PCO2ART 37.6 03/21/2018 0400   PO2ART 75.5 (L) 03/21/2018 0400   HCO3 23.4 03/21/2018 0400   TCO2 23 03/20/2018 1749   ACIDBASEDEF 0.6 03/21/2018 0400   O2SAT 95.3 03/21/2018 0400    CXR: PORTABLE CHEST 1 VIEW  COMPARISON:  Chest x-ray from yesterday.  FINDINGS: Interval removal of the endotracheal and enteric tubes. Unchanged right-sided and mediastinal chest tubes. Stable cardiomegaly status post aortic valve replacement. Unchanged small left pleural effusion and left lower lobe opacities. Slightly improved aeration in the right lung with minimal residual atelectasis. No pneumothorax. No acute osseous abnormality.  IMPRESSION: Unchanged small left pleural effusion with adjacent left lower lobe atelectasis versus infiltrate. Slightly improved aeration in the right lung.   Electronically Signed   By: Titus Dubin M.D.   On: 03/21/2018 07:46   EKG: NSR w/out acute ischemic changes, LBBB (new)   Assessment/Plan: S/P Procedure(s) (LRB):  AORTIC VALVE REPLACEMENT (AVR) (N/A) TRANSESOPHAGEAL ECHOCARDIOGRAM (TEE) (N/A)  Doing well POD1 Maintaining NSR w/ stable hemodynamics on very low dose Neo for BP support Breathing comfortably w/ O2 sats 97% on 2 L/min via [place] Expected post op acute blood loss anemia, Hgb 8.7 Expected post op atelectasis, mild LBBB, new   Mobilize  Wean Neo  D/C lines D/C tubes later today or tomorrow, depending on output  Hold beta blockers for now   Rexene Alberts, MD 03/21/2018 8:20 AM

## 2018-03-21 NOTE — Progress Notes (Signed)
[facility] BRIEF SICU PROGRESS NOTE  1 Day Post-Op  S/P Procedure(s) (LRB): AORTIC VALVE REPLACEMENT (AVR) (N/A) TRANSESOPHAGEAL ECHOCARDIOGRAM (TEE) (N/A)   Stable day NSR w/ stable BP Breathing comfortably on 2 L/min Diuresing well  Plan: Continue routine care  Rexene Alberts, MD 03/21/2018 5:51 PM

## 2018-03-22 ENCOUNTER — Inpatient Hospital Stay (HOSPITAL_COMMUNITY): Payer: Medicare HMO

## 2018-03-22 LAB — GLUCOSE, CAPILLARY
GLUCOSE-CAPILLARY: 119 mg/dL — AB (ref 65–99)
GLUCOSE-CAPILLARY: 106 mg/dL — AB (ref 65–99)
GLUCOSE-CAPILLARY: 112 mg/dL — AB (ref 65–99)
GLUCOSE-CAPILLARY: 101 mg/dL — AB (ref 65–99)

## 2018-03-22 LAB — CBC
HCT: 26.9 % — ABNORMAL LOW (ref 36.0–46.0)
Hemoglobin: 8.7 g/dL — ABNORMAL LOW (ref 12.0–15.0)
MCH: 30.6 pg (ref 26.0–34.0)
MCHC: 32.3 g/dL (ref 30.0–36.0)
MCV: 94.7 fL (ref 78.0–100.0)
PLATELETS: 133 10*3/uL — AB (ref 150–400)
RBC: 2.84 MIL/uL — ABNORMAL LOW (ref 3.87–5.11)
RDW: 14.1 % (ref 11.5–15.5)
WBC: 11.1 10*3/uL — ABNORMAL HIGH (ref 4.0–10.5)

## 2018-03-22 LAB — BASIC METABOLIC PANEL
Anion gap: 5 (ref 5–15)
BUN: 11 mg/dL (ref 6–20)
CALCIUM: 8.6 mg/dL — AB (ref 8.9–10.3)
CO2: 26 mmol/L (ref 22–32)
CREATININE: 0.59 mg/dL (ref 0.44–1.00)
Chloride: 104 mmol/L (ref 101–111)
GFR calc Af Amer: >60 mL/min (ref >60)
GFR calc non Af Amer: >60 mL/min (ref >60)
GLUCOSE: 119 mg/dL — AB (ref 65–99)
Potassium: 4.2 mmol/L (ref 3.5–5.1)
Sodium: 135 mmol/L (ref 135–145)

## 2018-03-22 MED ORDER — METOCLOPRAMIDE HCL 5 MG/ML IJ SOLN
10.0000 mg | Freq: Four times a day (QID) | INTRAMUSCULAR | Status: AC
  Administered 2018-03-22 – 2018-03-23 (×4): 10 mg via INTRAVENOUS
  Filled 2018-03-22 (×4): qty 2

## 2018-03-22 MED ORDER — MOVING RIGHT ALONG BOOK
Freq: Once | Status: AC
  Administered 2018-03-22: 1
  Filled 2018-03-22: qty 1

## 2018-03-22 MED ORDER — FUROSEMIDE 40 MG PO TABS
40.0000 mg | ORAL_TABLET | Freq: Every day | ORAL | Status: AC
  Administered 2018-03-23: 40 mg via ORAL
  Filled 2018-03-22: qty 1

## 2018-03-22 MED ORDER — POTASSIUM CHLORIDE CRYS ER 20 MEQ PO TBCR: 20.0000 meq | EXTENDED_RELEASE_TABLET | Freq: Every day | ORAL | Status: DC

## 2018-03-22 MED ORDER — FUROSEMIDE 10 MG/ML IJ SOLN
20.0000 mg | Freq: Two times a day (BID) | INTRAMUSCULAR | Status: AC
  Administered 2018-03-22 (×2): 20 mg via INTRAVENOUS
  Filled 2018-03-22 (×2): qty 2

## 2018-03-22 MED ORDER — METOPROLOL TARTRATE 12.5 MG HALF TABLET
12.5000 mg | ORAL_TABLET | Freq: Two times a day (BID) | ORAL | Status: DC
  Administered 2018-03-22 – 2018-03-24 (×5): 12.5 mg via ORAL
  Filled 2018-03-22 (×5): qty 1

## 2018-03-22 MED ORDER — ASPIRIN EC 81 MG PO TBEC
81.0000 mg | DELAYED_RELEASE_TABLET | Freq: Every day | ORAL | Status: DC
  Administered 2018-03-22 – 2018-03-24 (×3): 81 mg via ORAL
  Filled 2018-03-22 (×3): qty 1

## 2018-03-22 NOTE — Progress Notes (Addendum)
[address]       [place] [address]             [phone]        CARDIOTHORACIC SURGERY PROGRESS NOTE   R2 Days Post-Op Procedure(s) (LRB): AORTIC VALVE REPLACEMENT (AVR) (N/A) TRANSESOPHAGEAL ECHOCARDIOGRAM (TEE) (N/A)  Subjective: Looks good.  Some intermittent nausea.  Otherwise feels well.  Minimal pain.  No SOB  Objective: Vital signs: BP Readings from Last 1 Encounters:  03/22/18 136/60   Pulse Readings from Last 1 Encounters:  03/22/18 71   Resp Readings from Last 1 Encounters:  03/22/18 18   Temp Readings from Last 1 Encounters:  03/22/18 98.7 F (37.1 C) (Oral)    Hemodynamics: PAP: (24-30)/(8-13) 24/8  Physical Exam:  Rhythm:   sinus  Breath sounds: clear  Heart sounds:  RRR  Incisions:  Dressings dry, intact  Abdomen:  Soft, non-distended, non-tender  Extremities:  Warm, well-perfused   Intake/Output from previous day: 03/23 0701 - 03/24 0700 In: 1850.8 [P.O.:500; I.V.:550.8; IV Piggyback:800] Out: 9379 [KWIOX:7353; Chest Tube:430] Intake/Output this shift: Total I/O In: 120 [P.O.:120] Out: -   Lab Results:  CBC: Recent Labs    03/21/18 1757 03/21/18 1806 03/22/18 0348  WBC 13.0*  --  11.1*  HGB 9.6* 9.2* 8.7*  HCT 29.2* 27.0* 26.9*  PLT 154  --  133*    BMET:  Recent Labs    03/21/18 0329  03/21/18 1806 03/22/18 0348  NA 137  --  137 135  K 3.4*  --  3.7 4.2  CL 108  --  101 104  CO2 22  --   --  26  GLUCOSE 119*  --  128* 119*  BUN 9  --  11 11  CREATININE 0.52   < > 0.60 0.59  CALCIUM 8.0*  --   --  8.6*   < > = values in this interval not displayed.     PT/INR:   Recent Labs    03/20/18 1204  LABPROT 18.6*  INR 1.57    CBG (last 3)  Recent Labs    03/21/18 2345 03/22/18 0341 03/22/18 0735  GLUCAP 110* 119* 106*    ABG    Component Value Date/Time   PHART 7.411 03/21/2018 0400   PCO2ART 37.6 03/21/2018 0400   PO2ART 75.5 (L) 03/21/2018 0400   HCO3 23.4 03/21/2018 0400   TCO2 24 03/21/2018 1806   ACIDBASEDEF 0.6 03/21/2018 0400   O2SAT 95.3 03/21/2018 0400    CXR: PORTABLE CHEST 1 VIEW  COMPARISON:  Portable exam 0427 hours compared to 03/21/2018  FINDINGS: Interval removal of Swan-Ganz catheter.  RIGHT jugular catheter with tip projecting over confluence of the SVC.  Mediastinal drains and RIGHT thoracostomy tube again identified.  Enlargement of cardiac silhouette post AVR.  Slight pulmonary vascular congestion.  Persistent infiltrate in LEFT perihilar region with atelectasis of LEFT lower lobe.  No pneumothorax.  Bones demineralized.  IMPRESSION: Postsurgical changes as above with persistent atelectasis of LEFT lower lobe and mild LEFT perihilar infiltrate.   Electronically Signed   By: Lavonia Dana M.D.   On: 03/22/2018 07:52  Assessment/Plan: S/P Procedure(s) (LRB): AORTIC VALVE REPLACEMENT (AVR) (N/A) TRANSESOPHAGEAL ECHOCARDIOGRAM (TEE) (N/A)  Doing well POD2 Maintaining NSR w/ stable BP Breathing comfortably w/ O2 sats 96% on room air Expected post op acute blood loss anemia, Hgb 8.7 Expected post op atelectasis, mild, L>R, possible small L effusion LBBB, new   Mobilize  Diuresis  D/C tubes  Try low dose beta blocker  Transfer 4E   Rexene Alberts, MD 03/22/2018 9:11 AM

## 2018-03-23 ENCOUNTER — Other Ambulatory Visit: Payer: Self-pay

## 2018-03-23 ENCOUNTER — Inpatient Hospital Stay (HOSPITAL_COMMUNITY): Payer: Medicare HMO

## 2018-03-23 ENCOUNTER — Encounter (HOSPITAL_COMMUNITY): Payer: Self-pay | Admitting: Thoracic Surgery (Cardiothoracic Vascular Surgery)

## 2018-03-23 LAB — CBC
HEMATOCRIT: 26.7 % — AB (ref 36.0–46.0)
HEMOGLOBIN: 8.8 g/dL — AB (ref 12.0–15.0)
MCH: 31 pg (ref 26.0–34.0)
MCHC: 33 g/dL (ref 30.0–36.0)
MCV: 94 fL (ref 78.0–100.0)
Platelets: 148 10*3/uL — ABNORMAL LOW (ref 150–400)
RBC: 2.84 MIL/uL — AB (ref 3.87–5.11)
RDW: 14 % (ref 11.5–15.5)
WBC: 8.6 10*3/uL (ref 4.0–10.5)

## 2018-03-23 LAB — BASIC METABOLIC PANEL
Anion gap: 8 (ref 5–15)
BUN: 12 mg/dL (ref 6–20)
CHLORIDE: 104 mmol/L (ref 101–111)
CO2: 25 mmol/L (ref 22–32)
Calcium: 8.4 mg/dL — ABNORMAL LOW (ref 8.9–10.3)
Creatinine, Ser: 0.58 mg/dL (ref 0.44–1.00)
GFR calc Af Amer: >60 mL/min (ref >60)
GFR calc non Af Amer: >60 mL/min (ref >60)
Glucose, Bld: 104 mg/dL — ABNORMAL HIGH (ref 65–99)
POTASSIUM: 3.3 mmol/L — AB (ref 3.5–5.1)
SODIUM: 137 mmol/L (ref 135–145)

## 2018-03-23 LAB — GLUCOSE, CAPILLARY: GLUCOSE-CAPILLARY: 147 mg/dL — AB (ref 65–99)

## 2018-03-23 MED ORDER — LISINOPRIL 2.5 MG PO TABS
2.5000 mg | ORAL_TABLET | Freq: Every day | ORAL | Status: DC
  Administered 2018-03-23 – 2018-03-24 (×2): 2.5 mg via ORAL
  Filled 2018-03-23 (×2): qty 1

## 2018-03-23 MED ORDER — HYDROCHLOROTHIAZIDE 25 MG PO TABS
25.0000 mg | ORAL_TABLET | Freq: Every day | ORAL | Status: DC
  Administered 2018-03-24: 25 mg via ORAL
  Filled 2018-03-23: qty 1

## 2018-03-23 MED ORDER — POTASSIUM CHLORIDE CRYS ER 20 MEQ PO TBCR
40.0000 meq | EXTENDED_RELEASE_TABLET | Freq: Two times a day (BID) | ORAL | Status: AC
  Administered 2018-03-23 (×2): 40 meq via ORAL
  Filled 2018-03-23 (×2): qty 2

## 2018-03-23 MED FILL — Thrombin For Soln 20000 Unit: CUTANEOUS | Qty: 1 | Status: AC

## 2018-03-23 NOTE — Discharge Instructions (Signed)
Aortic Valve Replacement, Care After °Refer to this sheet in the next few weeks. These instructions provide you with information about caring for yourself after your procedure. Your health care provider may also give you more specific instructions. Your treatment has been planned according to current medical practices, but problems sometimes occur. Call your health care provider if you have any problems or questions after your procedure. °What can I expect after the procedure? °After the procedure, it is common to have: °· Pain around your incision area. °· A small amount of blood or clear fluid coming from your incision. ° °Follow these instructions at home: °Eating and drinking ° °· Follow instructions from your health care provider about eating or drinking restrictions. °? Limit alcohol intake to no more than 1 drink per day for nonpregnant women and 2 drinks per day for men. One drink equals 12 oz of beer, 5 oz of wine, or 1½ oz of hard liquor. °? Limit how much caffeine you drink. Caffeine can affect your heart's rate and rhythm. °· Drink enough fluid to keep your urine clear or pale yellow. °· Eat a heart-healthy diet. This should include plenty of fresh fruits and vegetables. If you eat meat, it should be lean cuts. Avoid foods that are: °? High in salt, saturated fat, or sugar. °? Canned or highly processed. °? Fried. °Activity °· Return to your normal activities as told by your health care provider. Ask your health care provider what activities are safe for you. °· Exercise regularly once you have recovered, as told by your health care provider. °· Avoid sitting for more than 2 hours at a time without moving. Get up and move around at least once every 1-2 hours. This helps to prevent blood clots in the legs. °· Do not lift anything that is heavier than 10 lb (4.5 kg) until your health care provider approves. °· Avoid pushing or pulling things with your arms until your health care provider approves. This  includes pulling on handrails to help you climb stairs. °Incision care ° °· Follow instructions from your health care provider about how to take care of your incision. Make sure you: °? Wash your hands with soap and water before you change your bandage (dressing). If soap and water are not available, use hand sanitizer. °? Change your dressing as told by your health care provider. °? Leave stitches (sutures), skin glue, or adhesive strips in place. These skin closures may need to stay in place for 2 weeks or longer. If adhesive strip edges start to loosen and curl up, you may trim the loose edges. Do not remove adhesive strips completely unless your health care provider tells you to do that. °· Check your incision area every day for signs of infection. Check for: °? More redness, swelling, or pain. °? More fluid or blood. °? Warmth. °? Pus or a bad smell. °Medicines °· Take over-the-counter and prescription medicines only as told by your health care provider. °· If you were prescribed an antibiotic medicine, take it as told by your health care provider. Do not stop taking the antibiotic even if you start to feel better. °Travel °· Avoid airplane travel for as long as told by your health care provider. °· When you travel, bring a list of your medicines and a record of your medical history with you. Carry your medicines with you. °Driving °· Ask your health care provider when it is safe for you to drive. Do not drive until your health   care provider approves. °· Do not drive or operate heavy machinery while taking prescription pain medicine. °Lifestyle ° °· Do not use any tobacco products, such as cigarettes, chewing tobacco, or e-cigarettes. If you need help quitting, ask your health care provider. °· Resume sexual activity as told by your health care provider. Do not use medicines for erectile dysfunction unless your health care provider approves, if this applies. °· Work with your health care provider to keep your  blood pressure and cholesterol under control, and to manage any other heart conditions that you have. °· Maintain a healthy weight. °General instructions °· Do not take baths, swim, or use a hot tub until your health care provider approves. °· Do not strain to have a bowel movement. °· Avoid crossing your legs while sitting down. °· Check your temperature every day for a fever. A fever may be a sign of infection. °· If you are a woman and you plan to become pregnant, talk with your health care provider before you become pregnant. °· Wear compression stockings if your health care provider instructs you to do this. These stockings help to prevent blood clots and reduce swelling in your legs. °· Tell all health care providers who care for you that you have an artificial (prosthetic) aortic valve. If you have or have had heart disease or endocarditis, tell all health care providers about these conditions as well. °· Keep all follow-up visits as told by your health care provider. This is important. °Contact a health care provider if: °· You develop a skin rash. °· You experience sudden, unexplained changes in your weight. °· You have more redness, swelling, or pain around your incision. °· You have more fluid or blood coming from your incision. °· Your incision feels warm to the touch. °· You have pus or a bad smell coming from your incision. °· You have a fever. °Get help right away if: °· You develop chest pain that is different from the pain coming from your incision. °· You develop shortness of breath or difficulty breathing. °· You start to feel light-headed. °These symptoms may represent a serious problem that is an emergency. Do not wait to see if the symptoms will go away. Get medical help right away. Call your local emergency services (911 in the [place] Do not drive yourself to the hospital. °This information is not intended to replace advice given to you by your health care provider. Make sure you discuss any  questions you have with your health care provider. °Document Released: 07/04/2005 Document Revised: 05/23/2016 Document Reviewed: 11/19/2015 °Elsevier Interactive Patient Education © 2017 [employer] ° °

## 2018-03-23 NOTE — Progress Notes (Signed)
Applied 02 at 2/l  N.C. for o2 sats 88 % while trying to sleep.

## 2018-03-23 NOTE — Progress Notes (Signed)
CARDIAC REHAB PHASE I   PRE:  Rate/Rhythm: 79mSR    Up in hall with RN  MODE:  Ambulation: 800 ft   POST:  Rate/Rhythm: 98 SR  BP:  Supine:   Sitting: 150/60  Standing:    SaO2: 92-93%RA 0850-0930 Pt up in hall with RN. Took over walk. Pt walked 800 ft on RA with rolling walker with steady gait and did not need to rest. Tolerated well. To recliner after walk. Pt has rolling walker at home if needed. Gave [facility] booklet and in the tube handout as these did not get moved with pt. Encouraged IS. Call light in reach.   Graylon Good, RN BSN  03/23/2018 9:27 AM

## 2018-03-23 NOTE — Discharge Summary (Signed)
Physician Discharge Summary  Patient ID: Cheyenne Jones MRN: [id number] DOB/AGE: [birth year] 76 y.o.  Admit date: 03/20/2018 Discharge date: 03/24/2018  Admission Diagnoses: Severe aortic stenosis  Discharge Diagnoses:  Principal Problem:   S/P aortic valve replacement with bioprosthetic valve Active Problems:   Essential hypertension, benign   Aortic stenosis, severe  Patient Active Problem List   Diagnosis Date Noted  . S/P aortic valve replacement with bioprosthetic valve 03/20/2018  . Aortic stenosis, severe 02/16/2018  . Essential hypertension, benign 09/13/2013  . Unspecified hypothyroidism 09/13/2013  . Unspecified vitamin D deficiency 09/13/2013  . Hip arthritis Right 09/05/2013    HPI: at time of consultation  Patient is a 77 year old female with history of aortic stenosis, hypertension, hypercholesterolemia, asthmatic bronchitis, and degenerative joint disease who has been referred for surgical consultation to discuss treatment options for management of severe symptomatic aortic stenosis. The patient has been physically active and healthy most of her adult life. She was noted to have a heart murmur on physical exam and diagnosed with aortic stenosis several years ago. She has been followed carefully for the past several years by Dr. Johnsie Cancel. Serial echocardiograms demonstrated progression in disease, and recently the patient has begun to experience increased fatigue with decreased exercise tolerance and mild exertional shortness of breath. Follow-up echocardiogram performed January 01, 2018 revealed severe aortic stenosis. Peak velocity across the aortic valve measured 4.6 m/s corresponding to mean transvalvular gradient estimated 52 mmHg. The DVI was quite low at 0.19. Left ventricular systolic function remain normal at 60%. The patient subsequently underwent left and right heart catheterization on February 16, 2018. She was found to have normal coronary artery anatomy  with no significant coronary artery disease. Mean transvalvular gradient across the aortic valve was measured 38 mmHg with aortic valve area calculated 0.72 cm. Pulmonary artery pressures were normal. The patient was referred for elective surgical consultation.  Patient is married and lives locally in [place] with her husband. She remains physically active. 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 She reports no specific physical limitations. She has mild unsteady gait related to previous right hip replacement. She otherwise walks without difficulty. She complains of progression of exertional fatigue. She states that she gets mildly short of breath with more strenuous exertion. She otherwise denies shortness of breath. She has not had chest pain or chest tightness either with activity or at rest. She denies any history of PND, orthopnea, lower extremity edema or syncope. She states that she occasionally gets dizzy transiently when she stands up from a sitting position.  The patient was admitted electively for the procedure, Aortic valve replacement.  Discharged Condition: good  Hospital Course: The patient was admitted electively and on 03/20/2018 taken to the operating room where she underwent the below described procedure.  She tolerated it well and was taken to the surgical intensive care unit in stable condition.  Post operative hospital course:  The patient is overall done quite well.  She was extubated uneventfully using standard protocols.  She has remained neurologically intact.  She has remained in sinus rhythm with stable hemodynamics.  She does have a new left bundle branch block on postoperative EKG.  All routine lines, monitors and drainage devices have been discontinued in the standard fashion.  She does have a postoperative mild volume overload but has responded well to diuretics.  She  has an expected acute blood loss anemia and values  have stabilized.  She has been started on oral iron supplement.  Additionally she has been started on folic acid.  Most recent hematocrit is 26.  She did have a mild postoperative thrombocytopenia and values are improving daily.  Most recent platelet count is 148,000.  She has had some mild hypertension and is started on a low-dose ACE inhibitor.  Blood sugars have been under good control using standard protocols.  Hemoglobin A1c is measured at 5.3.  Oxygen has been weaned and she maintains good saturations on room air.  He does have some mild postoperative atelectasis which is improving with routine incentive spirometry.  Incisions are healing well without evidence of infection.  She is tolerating diet.  She did have some mild nausea but this has resolved.  She is ambulating well using standard cardiac rehab protocols. BMET drawn this am shows resolution of hypokalemia as potassium is up to 4. Dr. Roxy Manns has seen and evaluated the patient. She is felt surgically stable for discharge today.  Consults: None  Significant Diagnostic Studies: routine post op labs and CXR's  Treatments: surgery:  CARDIOTHORACIC SURGERY OPERATIVE NOTE  Date of Procedure:                03/20/2018  Preoperative Diagnosis:      Severe Aortic Stenosis   Postoperative Diagnosis:    Same   Procedure:        Aortic Valve Replacement             Edwards Intuity Elite rapid deployment bovine pericardial tissue valve (size 23 mm, model # 8300AB, serial # [id number])              Surgeon:        Valentina Gu. Roxy Manns, MD  Assistant:       John Giovanni, PA-C  Anesthesia:    Midge Minium, MD  Operative Findings: ? Severe aortic stenosis ? Normal left ventricular systolic function   Discharge Exam: Blood pressure (!) 136/54, pulse 85, temperature 98.9 F (37.2 C), temperature source Oral, resp. rate (!) 24, height 5' 2.5" (1.588 m), weight 150 lb 4.8 oz (68.2  kg), SpO2 94 %. Cardiovascular: RRR, no murmur Pulmonary: Clear to auscultation bilaterally Abdomen: Soft, non tender, bowel sounds present. Extremities: Trace bilateral lower extremity edema. Wound: Clean and dry.  No erythema or signs of infection.    Disposition: Discharge disposition: 01-Home or Self Care        Allergies as of 03/24/2018      Reactions   Fenofibrate Other (See Comments)   LEG ACHES, 09/2012, SIDE EFFECTS   Keflex [cephalexin] Other (See Comments)   LEG ACHES, 09/2012, SIDE EFFECTS   Livalo [pitavastatin] Other (See Comments)   LEG ACHES   Lovastatin Other (See Comments)   LEG ACHES, SIDE EFFECTS   Pravastatin Other (See Comments)   LEG ACHES, SIDE EFFECTS      Medication List    TAKE these medications   amoxicillin 500 MG capsule Commonly known as:  AMOXIL Take 2,000 mg by mouth as directed. Prior to dental procedures   aspirin EC 81 MG tablet Take 81 mg by mouth daily.   CALCIUM 500 + D PO Take 2 tablets by mouth daily.   ferrous sulfate 325 (65 FE) MG tablet Take 1 tablet (325 mg total) by mouth daily with breakfast. For one month then stop.   FIBER SELECT GUMMIES Chew Chew 1 tablet by mouth daily.   folic acid 1 MG tablet Commonly  known as:  FOLVITE Take 1 tablet (1 mg total) by mouth daily. For one month then stop.   furosemide 40 MG tablet Commonly known as:  LASIX Take 1 tablet (40 mg total) by mouth daily. For 4 days then stop.   hydrochlorothiazide 25 MG tablet Commonly known as:  HYDRODIURIL Take 25 mg by mouth daily.   ibuprofen 200 MG tablet Commonly known as:  ADVIL,MOTRIN Take 200-400 mg by mouth daily as needed for headache or moderate pain.   levothyroxine 100 MCG tablet Commonly known as:  SYNTHROID, LEVOTHROID Take 100 mcg by mouth daily before breakfast.   lisinopril 2.5 MG tablet Commonly known as:  PRINIVIL,ZESTRIL Take 1 tablet (2.5 mg total) by mouth daily.   metoprolol tartrate 25 MG tablet Commonly  known as:  LOPRESSOR Take 0.5 tablets (12.5 mg total) by mouth 2 (two) times daily.   oxyCODONE 5 MG immediate release tablet Commonly known as:  Oxy IR/ROXICODONE Take 5 mg by mouth every 4-6 hours PRN severe pain.   potassium chloride SA 20 MEQ tablet Commonly known as:  K-DUR,KLOR-CON Take 1 tablet (20 mEq total) by mouth daily. For 4 days then stop.   Vitamin D 2000 units Caps Take 2,000 Units by mouth daily.      The patient has been discharged on:   1.Beta Blocker:  Yes [ y  ]                              No   [   ]                              If No, reason:  2.Ace Inhibitor/ARB: Yes [ y  ]                                     No  [    ]                                     If No, reason:  3.Statin:   Yes [   ]                  No  [ n  ]                  If No, reason:no CAD  4.Shela CommonsVelta Addison  [ y  ]                  No   [   ]                  If No, reason:  Follow-up Information    Rexene Alberts, MD. Go on 04/20/2018.   Specialty:  Cardiothoracic Surgery Why:  PA/LAT CXR to be taken (at [facility] which is in the same building as Dr. Guy Sandifer office) on 04/20/2018 at 9:00 am;Appointment time is at 9:30 am Contact information: [place] [address] [phone]        Josue Hector, MD. Go on 04/14/2018.   Specialty:  Cardiology Why:  Appointment time is at 8:45 am Contact information: [address] [place] [address] [phone]  Richardson Dopp T, PA-C. Go on 04/03/2018.   Specialties:  Cardiology, Physician Assistant Why:  Appointment time is at 11:15 am Contact information: [address] [place] [address] [phone]            Signed: Arnoldo Lenis 03/24/2018, 8:13 AM

## 2018-03-23 NOTE — Progress Notes (Addendum)
[address]       [place] [address]             [phone]      3 Days Post-Op Procedure(s) (LRB): AORTIC VALVE REPLACEMENT (AVR) (N/A) TRANSESOPHAGEAL ECHOCARDIOGRAM (TEE) (N/A) Subjective: Feels pretty well, some nausea but improving  Objective: Vital signs in last 24 hours: Temp:  [97.8 F (36.6 C)-99.2 F (37.3 C)] 97.8 F (36.6 C) (03/25 0513) Pulse Rate:  [71-79] 76 (03/25 0513) Cardiac Rhythm: Normal sinus rhythm;Bundle branch block (03/24 1910) Resp:  [14-23] 19 (03/25 0513) BP: (117-153)/(49-66) 137/59 (03/25 0513) SpO2:  [95 %-100 %] 100 % (03/25 0513) Weight:  [151 lb 6.4 oz (68.7 kg)] 151 lb 6.4 oz (68.7 kg) (03/25 0513)  Hemodynamic parameters for last 24 hours:    Intake/Output from previous day: 03/24 0701 - 03/25 0700 In: 1000 [P.O.:900; IV Piggyback:100] Out: 1250 [Urine:1150; Chest Tube:100] Intake/Output this shift: No intake/output data recorded.  General appearance: alert, cooperative and no distress Heart: regular rate and rhythm Lungs: mildly dim in left>right base Abdomen: benign Extremities: no edema Wound: dressing CDI *soft systolic murmur Lab Results: Recent Labs    03/22/18 0348 03/23/18 0244  WBC 11.1* 8.6  HGB 8.7* 8.8*  HCT 26.9* 26.7*  PLT 133* 148*   BMET:  Recent Labs    03/22/18 0348 03/23/18 0244  NA 135 137  K 4.2 3.3*  CL 104 104  CO2 26 25  GLUCOSE 119* 104*  BUN 11 12  CREATININE 0.59 0.58  CALCIUM 8.6* 8.4*    PT/INR:  Recent Labs    03/20/18 1204  LABPROT 18.6*  INR 1.57   ABG    Component Value Date/Time   PHART 7.411 03/21/2018 0400   HCO3 23.4 03/21/2018 0400   TCO2 24 03/21/2018 1806   ACIDBASEDEF 0.6 03/21/2018 0400   O2SAT 95.3 03/21/2018 0400   CBG (last 3)  Recent Labs    03/22/18 0735 03/22/18 1148 03/22/18 1646  GLUCAP 106* 112* 101*    Meds Scheduled Meds: . acetaminophen  1,000 mg Oral Q6H  . aspirin EC  81 mg Oral Daily  . bisacodyl  10 mg  Oral Daily   Or  . bisacodyl  10 mg Rectal Daily  . docusate sodium  200 mg Oral Daily  . enoxaparin (LOVENOX) injection  40 mg Subcutaneous QHS  . folic acid-pyridoxine-cyancobalamin  1 tablet Oral Daily  . furosemide  40 mg Oral Daily  . iron polysaccharides  150 mg Oral Daily  . levothyroxine  100 mcg Oral QAC breakfast  . metoprolol tartrate  12.5 mg Oral BID  . pantoprazole  40 mg Oral Daily  . potassium chloride  20 mEq Oral Daily  . sodium chloride flush  3 mL Intravenous Q12H   Continuous Infusions: . sodium chloride    . lactated ringers 20 mL/hr at 03/21/18 2000  . phenylephrine (NEO-SYNEPHRINE) Adult infusion Stopped (03/21/18 1003)   PRN Meds:.metoprolol tartrate, morphine injection, ondansetron (ZOFRAN) IV, oxyCODONE, sodium chloride flush, traMADol  Xrays Dg Chest Port 1 View  Result Date: 03/22/2018 CLINICAL DATA:  Atelectasis EXAM: PORTABLE CHEST 1 VIEW COMPARISON:  Portable exam 0427 hours compared to 03/21/2018 FINDINGS: Interval removal of Swan-Ganz catheter. RIGHT jugular catheter with tip projecting over confluence of the SVC. Mediastinal drains and RIGHT thoracostomy tube again identified. Enlargement of cardiac silhouette post AVR. Slight pulmonary vascular congestion. Persistent infiltrate in LEFT perihilar region with atelectasis of LEFT lower lobe. No  pneumothorax. Bones demineralized. IMPRESSION: Postsurgical changes as above with persistent atelectasis of LEFT lower lobe and mild LEFT perihilar infiltrate. Electronically Signed   By: Lavonia Dana M.D.   On: 03/22/2018 07:52    Assessment/Plan: S/P Procedure(s) (LRB): AORTIC VALVE REPLACEMENT (AVR) (N/A) TRANSESOPHAGEAL ECHOCARDIOGRAM (TEE) (N/A)  1 doing well overall 2 sinus rhythm, hemodyn stable, some systolic HTN at times but quite variable 3 replace K+ for 3.3 4 H/H stable, plateletscont to improve 5 blood sugars stable 6 d/c epw's- poss home 1-2 days  LOS: 3 days    John Giovanni 03/23/2018  I  have seen and examined the patient and agree with the assessment and plan as outlined.  Continue metoprolol and start low dose ACE-I.  Anticipate likely d/c home tomorrow.  Rexene Alberts, MD 03/23/2018 7:48 AM

## 2018-03-23 NOTE — Progress Notes (Signed)
Epicardial Pacing wires removed without difficulty per MD order. Will continue to monitor

## 2018-03-24 ENCOUNTER — Encounter (HOSPITAL_COMMUNITY): Payer: Self-pay | Admitting: Thoracic Surgery (Cardiothoracic Vascular Surgery)

## 2018-03-24 LAB — BASIC METABOLIC PANEL
Anion gap: 9 (ref 5–15)
BUN: 13 mg/dL (ref 6–20)
CO2: 26 mmol/L (ref 22–32)
Calcium: 8.6 mg/dL — ABNORMAL LOW (ref 8.9–10.3)
Chloride: 104 mmol/L (ref 101–111)
Creatinine, Ser: 0.5 mg/dL (ref 0.44–1.00)
Glucose, Bld: 97 mg/dL (ref 65–99)
Potassium: 4 mmol/L (ref 3.5–5.1)
SODIUM: 139 mmol/L (ref 135–145)

## 2018-03-24 MED ORDER — FUROSEMIDE 40 MG PO TABS: 40.0000 mg | ORAL_TABLET | Freq: Every day | ORAL | 0 refills | Status: DC

## 2018-03-24 MED ORDER — METOPROLOL TARTRATE 25 MG PO TABS: 12.5000 mg | ORAL_TABLET | Freq: Two times a day (BID) | ORAL | 1 refills | Status: DC

## 2018-03-24 MED ORDER — OXYCODONE HCL 5 MG PO TABS: ORAL_TABLET | ORAL | 0 refills | Status: DC

## 2018-03-24 MED ORDER — FERROUS SULFATE 325 (65 FE) MG PO TABS: 325.0000 mg | ORAL_TABLET | Freq: Every day | ORAL | 3 refills | Status: DC

## 2018-03-24 MED ORDER — LISINOPRIL 2.5 MG PO TABS: 2.5000 mg | ORAL_TABLET | Freq: Every day | ORAL | 1 refills | Status: DC

## 2018-03-24 MED ORDER — FUROSEMIDE 40 MG PO TABS
40.0000 mg | ORAL_TABLET | Freq: Every day | ORAL | Status: DC
  Administered 2018-03-24: 40 mg via ORAL
  Filled 2018-03-24: qty 1

## 2018-03-24 MED ORDER — FOLIC ACID 1 MG PO TABS: 1.0000 mg | ORAL_TABLET | Freq: Every day | ORAL | Status: DC

## 2018-03-24 MED ORDER — POTASSIUM CHLORIDE CRYS ER 20 MEQ PO TBCR: 20.0000 meq | EXTENDED_RELEASE_TABLET | Freq: Every day | ORAL | 0 refills | Status: DC

## 2018-03-24 MED ORDER — POTASSIUM CHLORIDE CRYS ER 20 MEQ PO TBCR
20.0000 meq | EXTENDED_RELEASE_TABLET | Freq: Every day | ORAL | Status: DC
  Administered 2018-03-24: 20 meq via ORAL
  Filled 2018-03-24: qty 1

## 2018-03-24 NOTE — Care Management Note (Signed)
Case Management Note Marvetta Gibbons RN, BSN Unit 4E-Case Manager [phone]    Patient Details  Name: Cheyenne Jones MRN: [id number] Date of Birth: March 18, 1941  Subjective/Objective:   Pt admitted s/p AVR                Action/Plan: PTA pt lived at home with spouse- plan to return home- no CM needs noted for transition to home.   Expected Discharge Date:  03/24/18               Expected Discharge Plan:  Home/Self Care  In-House Referral:  NA  Discharge planning Services  CM Consult  Post Acute Care Choice:    Choice offered to:  NA  DME Arranged:    DME Agency:     HH Arranged:    HH Agency:     Status of Service:  Completed, signed off  If discussed at [place] of Stay Meetings, dates discussed:    Discharge Disposition: home/self care   Additional Comments:  Dawayne Patricia, RN 03/24/2018, 11:53 AM

## 2018-03-24 NOTE — Care Management Important Message (Signed)
Important Message  Patient Details  Name: Cheyenne Jones MRN: [id number] Date of Birth: 1941-09-18   Medicare Important Message Given:  Yes    Emaad Nanna P Chester 03/24/2018, 1:19 PM

## 2018-03-24 NOTE — Consult Note (Signed)
            [facility] CM Primary Care Navigator  03/24/2018  Cheyenne Jones January 04, 1941 [id number]   Seen patientat the bedsideto identify possible discharge needs. Patientreports having "increased fatigue" with decreased exercise tolerance and mild exertional shortness of breath that had led to this admission/ surgery (status post aortic valve replacement).  PatientendorsesDr.Robert Jones with [place] [facility] primary care provider.   [facility] [address] [facility] any problem.   Patientstatesmanagingher own medications at [employer] out of the containers ("only taking a few"), but using "pill box" when travelling.  Patient reports that she has been driving prior to admission/ surgery but her husband Cheyenne Jones), son Cheyenne Jones) and son's girlfriend Cheyenne Jones) can be able toprovidetransportation to herdoctors'appointments after discharge.  Patient verbalized being physically active and healthy most of her life. Her husbandwill serve asherprimary caregiverwhendischarged.   Anticipated discharge plan ishomeperpatient.  Patient voiced understanding to call primary care provider's office whenshereturns home, for a post discharge follow-up appointment within1- 2 weeksor sooner if needed.Patient letter (with PCP's contact number) was provided asareminder.  Explained topatientregardingTHN CM services available for health managementat homebut shedeniesany current needs or concernsat this point and states being able to manage health needs at home.  Patientexpressedunderstandingto seek referral from primary care provider to [facility] care management ifnecessaryand deemed appropriate for anyservices in the future.  [facility] care management information provided for future needs thatshemay have.  Patienthowever,verbally agreedforEMMIcalls tofollowup with her  recoveryat home.  Referral made for [facility] calls after discharge.   For additional questions please contact:  Cheyenne Jones, BSN, RN-BC [facility] PRIMARY CARE Navigator Cell: [phone]

## 2018-03-24 NOTE — Progress Notes (Signed)
[id number] Education completed with pt and husband who voiced understanding. Discussed sternal precautions, IS, ex ed and CRP 2. Referring to [place] CRP 2. Gave heart healthy diet and encouraged pt to watch sodium. Graylon Good RN BSN 03/24/2018 11:35 AM

## 2018-03-24 NOTE — Progress Notes (Addendum)
      [address]       [place] [address]             [phone]        4 Days Post-Op Procedure(s) (LRB): AORTIC VALVE REPLACEMENT (AVR) (N/A) TRANSESOPHAGEAL ECHOCARDIOGRAM (TEE) (N/A)  Subjective: Patient without complaints this am. She hopes to go home.  Objective: Vital signs in last 24 hours: Temp:  [97.8 F (36.6 C)-98.9 F (37.2 C)] 98.9 F (37.2 C) (03/26 0443) Pulse Rate:  [85-110] 85 (03/26 0443) Cardiac Rhythm: Normal sinus rhythm (03/25 1901) Resp:  [12-26] 24 (03/26 0443) BP: (109-142)/(48-108) 136/54 (03/26 0443) SpO2:  [93 %-94 %] 94 % (03/26 0443) Weight:  [150 lb 4.8 oz (68.2 kg)-151 lb (68.5 kg)] 150 lb 4.8 oz (68.2 kg) (03/26 0602)  Pre op weight 66.2 kg Current Weight  03/24/18 150 lb 4.8 oz (68.2 kg)      Intake/Output from previous day: 03/25 0701 - 03/26 0700 In: 375 [P.O.:375] Out: -    Physical Exam:  Cardiovascular: RRR, no murmur Pulmonary: Clear to auscultation bilaterally Abdomen: Soft, non tender, bowel sounds present. Extremities: Trace bilateral lower extremity edema. Wound: Clean and dry.  No erythema or signs of infection.  Lab Results: CBC: Recent Labs    03/22/18 0348 03/23/18 0244  WBC 11.1* 8.6  HGB 8.7* 8.8*  HCT 26.9* 26.7*  PLT 133* 148*   BMET:  Recent Labs    03/23/18 0244 03/24/18 0319  NA 137 139  K 3.3* 4.0  CL 104 104  CO2 25 26  GLUCOSE 104* 97  BUN 12 13  CREATININE 0.58 0.50  CALCIUM 8.4* 8.6*    PT/INR:  Lab Results  Component Value Date   INR 1.57 03/20/2018   INR 1.04 03/18/2018   INR 1.1 02/12/2018   ABG:  INR: Will add last result for INR, ABG once components are confirmed Will add last 4 CBG results once components are confirmed  Assessment/Plan:  1. CV - BP better controlled. On Lopressor 25 mg bid, HCTZ 25 mg daily, and Lisinopril 2.5 mg daily. 2.  Pulmonary - on room air. Encourage incentive spirometer. 3.  Acute blood loss anemia - H and H  yesterday stable at 8.8 and 26.7. Continue Foltx 4. Discharge later today   Donielle M ZimmermanPA-C 03/24/2018,7:47 AM  I have seen and examined the patient and agree with the assessment and plan as outlined.  D/C home today  Rexene Alberts, MD 03/24/2018 8:15AM

## 2018-03-25 ENCOUNTER — Telehealth (HOSPITAL_COMMUNITY): Payer: Self-pay

## 2018-03-25 MED FILL — Heparin Sodium (Porcine) Inj 1000 Unit/ML: INTRAMUSCULAR | Qty: 2500 | Status: AC

## 2018-03-25 MED FILL — Mannitol IV Soln 20%: INTRAVENOUS | Qty: 1000 | Status: AC

## 2018-03-25 MED FILL — Lidocaine HCl IV Inj 20 MG/ML: INTRAVENOUS | Qty: 25 | Status: AC

## 2018-03-25 MED FILL — Electrolyte-R (PH 7.4) Solution: INTRAVENOUS | Qty: 3000 | Status: AC

## 2018-03-25 MED FILL — Sodium Chloride IV Soln 0.9%: INTRAVENOUS | Qty: 2000 | Status: AC

## 2018-03-25 MED FILL — Magnesium Sulfate Inj 50%: INTRAMUSCULAR | Qty: 10 | Status: AC

## 2018-03-25 MED FILL — Sodium Bicarbonate IV Soln 8.4%: INTRAVENOUS | Qty: 50 | Status: AC

## 2018-03-25 MED FILL — Heparin Sodium (Porcine) Inj 1000 Unit/ML: INTRAMUSCULAR | Qty: 30 | Status: AC

## 2018-03-25 MED FILL — Heparin Sodium (Porcine) Inj 1000 Unit/ML: INTRAMUSCULAR | Qty: 10 | Status: AC

## 2018-03-25 MED FILL — Dexmedetomidine HCl in NaCl 0.9% IV Soln 400 MCG/100ML: INTRAVENOUS | Qty: 100 | Status: AC

## 2018-03-25 MED FILL — Potassium Chloride Inj 2 mEq/ML: INTRAVENOUS | Qty: 40 | Status: AC

## 2018-03-25 NOTE — Telephone Encounter (Signed)
Patients insurance is active and benefits verified through [facility] - $10.00 co-pay, no deductible, out of pocket amount of $3,400/$735.00 has been met, no co-insurance, and no pre-authorization is required. Passport/reference [phone]  Will contact patient to see if interested in the CR program. If interested, patient will need to complete follow up appt. Once completed, patient will be contacted for scheduling upon review by the RN Navigator.

## 2018-03-25 NOTE — Telephone Encounter (Signed)
Called to speak with patient to see if interested in CR - Patient is unsure right now as she has not fully recovered. Explained to patient our scheduling process and will follow up after she completes follow up appt. Went over insurance with patient and patient verbally stated she understands what she is responsible for.

## 2018-03-26 ENCOUNTER — Other Ambulatory Visit: Payer: Self-pay

## 2018-03-26 NOTE — Patient Outreach (Signed)
[facility]) Care Management  03/26/2018  Cheyenne Jones 12-28-1941 [id number]  EMMI: General discharge red alert Referral date: 03/26/18 Referral reason:Know who to call about changes in condition: no Day # 1  Telephone call to patient regarding EMMI general discharge red alert. HIPAA verified. Discussed EMMI general discharge follow up.  Patient states she knows to call her surgeon if she starts having problems. RNCM inquired if patient has contacted her primary MD and scheduled appointment. Patient states she thinks she has an appointment with him but unsure.  RNCM reviewed discharge summary with patient. Advised patient to call her primary MD office for post hospital discharge follow up appointment. Patient verbalized understanding. Patient reports she has an appointment scheduled with her surgeon, Dr. Roxy Manns on 04/03/18 and and 04/20/18 and has an  appointment with her cardiologist on 04/14/18.   Patient states she was not discharged from the hospital with home health. Patient state she has excellent support from her husband and neighbors.  Patient reports she has all of her medications and takes them as prescribed. She has transportation to her appointments.  RNCm reviewed signs of infection with patient. Patient denies any signs of infection at incision site. Patient reports pain is minimal. Patient states, " I'm just worn out at times."  RNCM advised patient to keep taking medication as prescribed and keep follow up appointments.  Patient made aware she would receive another automated call from [facility] program. Patient verbalized understanding.  RNCM advised patient to notify MD of any changes in condition prior to scheduled appointment. RNCM provided contact name and number: [phone] or main office number [phone] and 24 hour nurse advise line [phone].  RNCM verified patient aware of 911 services for urgent/ emergent needs.  PLAN: RNCM will close patient due to  patient being assessed and having no further needs.   Quinn Plowman RN,BSN,CCM [facility] Telephonic  [phone]

## 2018-04-03 ENCOUNTER — Ambulatory Visit: Payer: Medicare HMO | Admitting: Physician Assistant

## 2018-04-03 ENCOUNTER — Encounter: Payer: Self-pay | Admitting: Physician Assistant

## 2018-04-03 VITALS — BP 92/58 | HR 81 | Ht 63.5 in | Wt 143.8 lb

## 2018-04-03 DIAGNOSIS — E7800 Pure hypercholesterolemia, unspecified: Secondary | ICD-10-CM | POA: Insufficient documentation

## 2018-04-03 DIAGNOSIS — I10 Essential (primary) hypertension: Secondary | ICD-10-CM | POA: Diagnosis not present

## 2018-04-03 DIAGNOSIS — Z953 Presence of xenogenic heart valve: Secondary | ICD-10-CM

## 2018-04-03 DIAGNOSIS — I739 Peripheral vascular disease, unspecified: Secondary | ICD-10-CM

## 2018-04-03 DIAGNOSIS — I779 Disorder of arteries and arterioles, unspecified: Secondary | ICD-10-CM

## 2018-04-03 HISTORY — DX: Disorder of arteries and arterioles, unspecified: I77.9

## 2018-04-03 LAB — BASIC METABOLIC PANEL
BUN / CREAT RATIO: 23 (ref 12–28)
BUN: 17 mg/dL (ref 8–27)
CHLORIDE: 99 mmol/L (ref 96–106)
CO2: 27 mmol/L (ref 20–29)
Calcium: 10.2 mg/dL (ref 8.7–10.3)
Creatinine, Ser: 0.73 mg/dL (ref 0.57–1.00)
GFR calc Af Amer: 93 mL/min/{1.73_m2} (ref >59)
GFR calc non Af Amer: 81 mL/min/{1.73_m2} (ref >59)
GLUCOSE: 92 mg/dL (ref 65–99)
POTASSIUM: 4.5 mmol/L (ref 3.5–5.2)
SODIUM: 139 mmol/L (ref 134–144)

## 2018-04-03 LAB — CBC
Hematocrit: 31.6 % — ABNORMAL LOW (ref 34.0–46.6)
Hemoglobin: 10.7 g/dL — ABNORMAL LOW (ref 11.1–15.9)
MCH: 31.8 pg (ref 26.6–33.0)
MCHC: 33.9 g/dL (ref 31.5–35.7)
MCV: 94 fL (ref 79–97)
PLATELETS: 482 10*3/uL — AB (ref 150–379)
RBC: 3.37 x10E6/uL — ABNORMAL LOW (ref 3.77–5.28)
RDW: 13.2 % (ref 12.3–15.4)
WBC: 9.2 10*3/uL (ref 3.4–10.8)

## 2018-04-03 MED ORDER — ASPIRIN EC 325 MG PO TBEC: 325.0000 mg | DELAYED_RELEASE_TABLET | Freq: Every day | ORAL | 0 refills | Status: DC

## 2018-04-03 NOTE — Patient Instructions (Addendum)
Medication Instructions:  STOP taking HCTZ (hydrochlorothiazide)  Increase Aspirin to 325 mg Once daily (continue this at least until you see Dr. Roxy Manns) - After 05/20/18, you can reduce back to 81 mg Once daily   Labwork: Today - BMET, CBC  Testing/Procedures: Schedule an echocardiogram in 2 months  Follow-Up: Jenkins Rouge, MD in 2-3 months  Any Other Special Instructions Will Be Listed Below (If Applicable).  Ask Dr. Marisue Humble if you have ever taken Zetia - let us know if you have not  If your blood pressure increases to 140/90 or higher, call us.  If you have dental work, you will need to take an antibiotic 1 hour before your procedure - call us to get the antibiotic filled  If you need a refill on your cardiac medications before your next appointment, please call your pharmacy.

## 2018-04-03 NOTE — Progress Notes (Signed)
Cardiology Office Note:    Date:  04/03/2018   ID:  Cheyenne Jones, DOB [birth year], MRN [id number]  PCP:  Gaynelle Arabian, MD  Cardiologist:  Jenkins Rouge, MD   Referring MD: Gaynelle Arabian, MD   Chief Complaint  Patient presents with  . Hospitalization Follow-up    s/p AVR    History of Present Illness:    Cheyenne Jones is a 77 y.o. female with aortic stenosis, hypertension, hyperlipidemia.  She was evaluated by Dr. Johnsie Cancel February 2019 for severe aortic stenosis noted on echocardiogram with mean gradient 52 mmHg.  Cardiac catheterization in February 2019 demonstrated normal coronary arteries.  She was referred to Dr. Roxy Manns for consideration of aortic valve replacement.  She was admitted 3/22-3/26 and underwent bioprosthetic aortic valve replacement.  Postoperative course was fairly uneventful.  Ms. Pokorney returns for post hospital follow-up.  She is here with her husband (I saw his mother years ago for high BP - she passed away a few mos ago at 66).  The patient is progressing well.  She denies shortness of breath, paroxysmal nocturnal dyspnea, edema.  She has some chest soreness off and on.   She denies fever.  She denies syncope.  But, she has been near syncopal with standing at times.  Her BP is low today.    Prior CV studies:   The following studies were reviewed today:  Carotid US 03/18/18 Bilateral ICA 40-59  Cardiac catheterization 02/16/18 Normal coronary arteries EF >65 AV mean gradient 30; AVA 0.72  Echo 01/01/18 EF 55-60, normal wall motion, grade 1 diastolic dysfunction, critical aortic stenosis (mean 52, peak 86), mild TR   Past Medical History:  Diagnosis Date  . Adenomatous colon polyp   . Allergy   . Arthritis   . Bronchitis, asthmatic   . Carotid artery disease ([place]) 04/03/2018   Carotid US 03/18/18- Bilateral ICA 40-59  . Diverticulosis   . DJD (degenerative joint disease)   . Hypercholesterolemia   . Hypertension   . Hypothyroidism   . Osteopenia   .  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 Vitamin D deficiency    Surgical Hx: The patient  has a past surgical history that includes Tonsillectomy; Breast surgery (Right); Finger surgery (Right); Colonoscopy w/ polypectomy; Total hip arthroplasty (Right, 09/06/2013); RIGHT/LEFT HEART CATH AND CORONARY ANGIOGRAPHY (N/A, 02/16/2018); Multiple tooth extractions; Aortic valve replacement (N/A, 03/20/2018); and TEE without cardioversion (N/A, 03/20/2018).   Current Medications: Current Meds  Medication Sig  . amoxicillin (AMOXIL) 500 MG capsule Take 2,000 mg by mouth as directed. Prior to dental procedures   . Calcium Carbonate-Vitamin D (CALCIUM 500 + D PO) Take 2 tablets by mouth daily.  . Cholecalciferol (VITAMIN D) 2000 units CAPS Take 2,000 Units by mouth daily.  . ferrous sulfate 325 (65 FE) MG tablet Take 1 tablet (325 mg total) by mouth daily with breakfast. For one month then stop.  Marland Kitchen FIBER SELECT GUMMIES CHEW Chew 1 tablet by mouth daily.   . folic acid (FOLVITE) 1 MG tablet Take 1 tablet (1 mg total) by mouth daily. For one month then stop.  Marland Kitchen ibuprofen (ADVIL,MOTRIN) 200 MG tablet Take 200-400 mg by mouth daily as needed for headache or moderate pain.   Marland Kitchen levothyroxine (SYNTHROID, LEVOTHROID) 100 MCG tablet Take 100 mcg by mouth daily before breakfast.  . lisinopril (PRINIVIL,ZESTRIL) 2.5 MG tablet Take  1 tablet (2.5 mg total) by mouth daily.  . metoprolol tartrate (LOPRESSOR) 25 MG tablet Take 0.5 tablets (12.5 mg total) by mouth 2 (two) times daily.  . Multiple Vitamins-Minerals (MULTIVITAMIN PO) Take 1 tablet by mouth daily.  . [DISCONTINUED] aspirin EC 81 MG tablet Take 81 mg by mouth daily.  . [DISCONTINUED] hydrochlorothiazide (HYDRODIURIL) 25 MG tablet Take 25 mg by mouth daily.     Allergies:   Fenofibrate; Keflex [cephalexin]; Livalo  [pitavastatin]; Lovastatin; and Pravastatin   Social History   Tobacco Use  . Smoking status: Never Smoker  . Smokeless tobacco: Never Used  Substance Use Topics  . Alcohol use: Yes    Alcohol/week: 1.2 oz    Types: 2 Glasses of wine per week    Comment: occasional  . Drug use: No     Family Hx: The patient's family history includes Colon polyps in her brother; Ovarian cancer in her sister.  ROS:   Please see the history of present illness.    ROS All other systems reviewed and are negative.   EKGs/Labs/Other Test Reviewed:    EKG:  EKG is  ordered today.  The ekg ordered today demonstrates NSR, HR 81, normal axis, IVCD - ? Incomplete LBBB, QTc 476  Recent Labs: 02/12/2018: TSH 0.835 03/18/2018: ALT 25 03/21/2018: Magnesium 2.2 03/23/2018: Hemoglobin 8.8; Platelets 148 03/24/2018: BUN 13; Creatinine, Ser 0.50; Potassium 4.0; Sodium 139   Recent Lipid Panel No results found for: CHOL, TRIG, HDL, CHOLHDL, LDLCALC, LDLDIRECT  Physical Exam:    VS:  BP (!) 92/58   Pulse 81   Ht 5' 3.5" (1.613 m)   Wt 143 lb 12.8 oz (65.2 kg)   BMI 25.07 kg/m     Wt Readings from Last 3 Encounters:  04/03/18 143 lb 12.8 oz (65.2 kg)  03/24/18 150 lb 4.8 oz (68.2 kg)  03/18/18 146 lb (66.2 kg)     Physical Exam  Constitutional: She is oriented to person, place, and time. She appears well-developed and well-nourished. No distress.  HENT:  Head: Normocephalic and atraumatic.  Neck: Neck supple.  Cardiovascular: Normal rate and regular rhythm.  No murmur heard. Pulmonary/Chest: Effort normal. She has no rales.  Median sternotomy well healed - no erythema or d/c  Abdominal: Soft.  Musculoskeletal: She exhibits no edema.  Neurological: She is alert and oriented to person, place, and time.  Skin: Skin is warm and dry.    ASSESSMENT & PLAN:    #1.  S/P aortic valve replacement with bioprosthetic valve She is progressing somewhat slowly since discharge from the hospital.  I have  encouraged her to pursue cardiac rehabilitation.  She sees Dr. Roxy Manns in follow-up 04/20/18.  I have asked her to increase her aspirin to 325 mg daily for 90 days postop, then reduce to 81 mg daily.  We discussed the importance of SBE prophylaxis.  -Increase ASA to 325 mg QD x 90 days   -Echo in ~ 2 months  -Start [facility] after seeing Dr. Roxy Manns.  #2.  Bilateral carotid artery disease, unspecified type ([place]) Preop carotid Dopplers demonstrated 40-59% stenosis.  Continue aspirin.  She has been intolerant to multiple statins.  She will need a repeat carotid US in 1 year.  #3.  Essential hypertension, benign She is actually hypotensive.  She is symptomatic.  I have asked her to stop her HCTZ.  -DC HCTZ  -BMET, CBC  -She will call if BP >/= 140/90  #4.  Hypercholesterolemia LDL 122 in 12/18.  She has bilateral carotid dz.  I have asked her to discuss with her primary care provider.  If she has not taken Zetia in the past, we can certainly try that.  We can also consider referring her to the lipid clinic for consideration of PCSK-9 inhibitor therapy.   Dispo:  Return in about 3 months (around 07/03/2018) for Routine Follow Up w/ Dr. Johnsie Cancel.   Medication Adjustments/Labs and Tests Ordered: Current medicines are reviewed at length with the patient today.  Concerns regarding medicines are outlined above.  Tests Ordered: Orders Placed This Encounter  Procedures  . Basic Metabolic Panel (BMET)  . CBC  . EKG 12-Lead  . ECHOCARDIOGRAM COMPLETE   Medication Changes: Meds ordered this encounter  Medications  . aspirin EC 325 MG tablet    Sig: Take 1 tablet (325 mg total) by mouth daily.    Dispense:  30 tablet    Refill:  0    Signed, Richardson Dopp, PA-C  04/03/2018 11:50 AM    [facility] [place], [place],   [address] Phone: [phone]; Fax: [phone]

## 2018-04-10 ENCOUNTER — Telehealth (HOSPITAL_COMMUNITY): Payer: Self-pay

## 2018-04-10 NOTE — Telephone Encounter (Signed)
Called and spoke with patient in regards to Cardiac Rehab - Patient stated she does not drive and cannot participate at this time. Closed referral.

## 2018-04-14 ENCOUNTER — Ambulatory Visit: Payer: Medicare HMO | Admitting: Cardiovascular Disease

## 2018-04-15 ENCOUNTER — Other Ambulatory Visit: Payer: Self-pay | Admitting: Thoracic Surgery (Cardiothoracic Vascular Surgery)

## 2018-04-15 DIAGNOSIS — Z952 Presence of prosthetic heart valve: Secondary | ICD-10-CM

## 2018-04-20 ENCOUNTER — Ambulatory Visit
Admission: RE | Admit: 2018-04-20 | Discharge: 2018-04-20 | Disposition: A | Discharge status: Home / Self Care | Payer: Medicare HMO | Source: Ambulatory Visit | Attending: Thoracic Surgery (Cardiothoracic Vascular Surgery) | Admitting: Thoracic Surgery (Cardiothoracic Vascular Surgery)

## 2018-04-20 ENCOUNTER — Ambulatory Visit (INDEPENDENT_AMBULATORY_CARE_PROVIDER_SITE_OTHER): Payer: Self-pay | Admitting: Physician Assistant

## 2018-04-20 VITALS — BP 117/72 | HR 84 | Resp 20 | Ht 63.0 in | Wt 146.0 lb

## 2018-04-20 DIAGNOSIS — Z952 Presence of prosthetic heart valve: Secondary | ICD-10-CM

## 2018-04-20 DIAGNOSIS — J90 Pleural effusion, not elsewhere classified: Secondary | ICD-10-CM | POA: Diagnosis not present

## 2018-04-20 DIAGNOSIS — I350 Nonrheumatic aortic (valve) stenosis: Secondary | ICD-10-CM

## 2018-04-20 NOTE — Addendum Note (Signed)
Addended by: Charlena Cross F on: 04/20/2018 03:56 PM   Modules accepted: Orders

## 2018-04-20 NOTE — Patient Instructions (Signed)
Endocarditis is a potentially serious infection of heart valves or inside lining of the heart.  It occurs more commonly in patients with diseased heart valves (such as patient's with aortic or mitral valve disease) and in patients who have undergone heart valve repair or replacement.  Certain surgical and dental procedures may put you at risk, such as dental cleaning, other dental procedures, or any surgery involving the respiratory, urinary, gastrointestinal tract, gallbladder or prostate gland.   To minimize your chances for develooping endocarditis, maintain good oral health and seek prompt medical attention for any infections involving the mouth, teeth, gums, skin or urinary tract.    Always notify your doctor or dentist about your underlying heart valve condition before having any invasive procedures. You will need to take antibiotics before certain procedures, including all routine dental cleanings or other dental procedures.  Your cardiologist or dentist should prescribe these antibiotics for you to be taken ahead of time.    You may return to driving an automobile as long as you are no longer requiring oral narcotic pain relievers during the daytime.  It would be wise to start driving only short distances during the daylight and gradually increase from there as you feel comfortable.  Make every effort to stay physically active, get some type of exercise on a regular basis, and stick to a "heart healthy diet".  The long term benefits for regular exercise and a healthy diet are critically important to your overall health and wellbeing.   You are encouraged to enroll and participate in the outpatient cardiac rehab program beginning as soon as practical.

## 2018-04-20 NOTE — Progress Notes (Signed)
Cheyenne Jones is a 77 y.o. female patient s/p Aortic valve replacement with Dr. Roxy Manns. Her post-op course was unremarkable. She presents today for her 4 week follow-up.  Overall she is doing very well.  She still does have some generalized fatigue.   1. S/P AVR   2. Aortic stenosis, severe    Past Medical History:  Diagnosis Date  . Adenomatous colon polyp   . Allergy   . Arthritis   . Bronchitis, asthmatic   . Carotid artery disease ([place]) 04/03/2018   Carotid US 03/18/18- Bilateral ICA 40-59  . Diverticulosis   . DJD (degenerative joint disease)   . Hypercholesterolemia   . Hypertension   . Hypothyroidism   . Osteopenia   . 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 Vitamin D deficiency    No past surgical history pertinent negatives on file. Scheduled Meds: Current Outpatient Medications on File Prior to Visit  Medication Sig Dispense Refill  . amoxicillin (AMOXIL) 500 MG capsule Take 2,000 mg by mouth as directed. Prior to dental procedures     . aspirin EC 325 MG tablet Take 1 tablet (325 mg total) by mouth daily. 30 tablet 0  . Calcium Carbonate-Vitamin D (CALCIUM 500 + D PO) Take 2 tablets by mouth daily.    . Cholecalciferol (VITAMIN D) 2000 units CAPS Take 2,000 Units by mouth daily.    . ferrous sulfate 325 (65 FE) MG tablet Take 1 tablet (325 mg total) by mouth daily with breakfast. For one month then stop. 30 tablet 3  . FIBER SELECT GUMMIES CHEW Chew 1 tablet by mouth daily.     . folic acid (FOLVITE) 1 MG tablet Take 1 tablet (1 mg total) by mouth daily. For one month then stop.    Marland Kitchen ibuprofen (ADVIL,MOTRIN) 200 MG tablet Take 200-400 mg by mouth daily as needed for headache or moderate pain.     Marland Kitchen levothyroxine (SYNTHROID, LEVOTHROID) 100 MCG tablet Take 100 mcg by mouth daily before breakfast.    . lisinopril (PRINIVIL,ZESTRIL) 2.5  MG tablet Take 1 tablet (2.5 mg total) by mouth daily. 30 tablet 1  . metoprolol tartrate (LOPRESSOR) 25 MG tablet Take 0.5 tablets (12.5 mg total) by mouth 2 (two) times daily. 30 tablet 1  . Multiple Vitamins-Minerals (MULTIVITAMIN PO) Take 1 tablet by mouth daily.     No current facility-administered medications on file prior to visit.      Allergies  Allergen Reactions  . Fenofibrate Other (See Comments)    LEG ACHES, 09/2012, SIDE EFFECTS  . Keflex [Cephalexin] Other (See Comments)    LEG ACHES, 09/2012, SIDE EFFECTS  . Livalo [Pitavastatin] Other (See Comments)    LEG ACHES  . Lovastatin Other (See Comments)    LEG ACHES, SIDE EFFECTS  . Pravastatin Other (See Comments)    LEG ACHES, SIDE EFFECTS    Blood pressure 117/72, pulse 84, resp. rate 20, height 5\' 3"  (1.6 m), weight 146 lb (66.2 kg), SpO2 97 %.  Subjective : Cheyenne Jones presents today for her routine for a follow-up visit status post aortic valve replacement with Dr. Roxy Manns.  Overall she feels well.  She does have some allergies and a dry cough today.  She has had some generalized fatigue which may be due to not getting enough sleep at night.  Occasionally she does take a nap during the  day.  Objective  Cor: RRR, no murmur Pulm: CTA bilaterally in all fields Abd: No tenderness Ext:No edema Wound: clean and dry sternal incision.    CLINICAL DATA:  Status post aortic valve replacement on 03/20/2018. Left pleural effusion.  EXAM: CHEST - 2 VIEW  COMPARISON:  Radiographs dated 03/23/2018 and 03/18/2018  FINDINGS: Almost complete of the left pleural effusion since the prior chest x-ray. Minimal residual blunting of the costophrenic angles posteriorly. Lungs are clear. No pneumothorax.  Chronic cardiomegaly. Prosthetic aortic valve. Aortic atherosclerosis.  IMPRESSION: Tiny residual bilateral pleural effusions.  Chronic cardiomegaly.   Electronically Signed   By: Lorriane Shire M.D.   On:  04/20/2018 13:42   Assessment & Plan   Cheyenne Jones overall is doing quite well.  She is walking several times a day and occasionally has some shortness of breath fatigue with flights of stairs.  She does feel like she is getting stronger.  She did, however ask for a handicap sign for her car for when she goes shopping.  She will need to obtain the form from her medical doctor and we will be happy to fill this out.  She has an appointment with Dr. Johnsie Cancel in a few months and an echocardiogram in June.  We discussed participating in a cardiac rehab program 3 times a week and the patient wanted to think on this at the current time.  I also cleared her for driving since she is no longer requiring narcotic pain medicine.  In fact, she never filled her prescription.  She is to continue all her cardiac medication today since her blood pressure and heart rate are well controlled.  I reviewed her chest x-ray with the patient.  She does not have any pneumothorax and I cannot appreciate any pleural effusions.  Her sternal wires are in line and I can visualize her aortic valve.  We discussed getting a prescription for antibiotics from her dentist before any routine cleanings or procedures.  She will be scheduled for a follow-up appointment with Dr. Zenia Jones in 2 months.  She can feel free to call our office at any time if she has any questions or concerns before that appointment.   Elgie Collard 04/20/2018

## 2018-05-14 ENCOUNTER — Other Ambulatory Visit: Payer: Self-pay

## 2018-05-14 MED ORDER — LISINOPRIL 2.5 MG PO TABS: 2.5000 mg | ORAL_TABLET | Freq: Every day | ORAL | 11 refills | Status: DC

## 2018-05-22 ENCOUNTER — Other Ambulatory Visit: Payer: Self-pay | Admitting: Physician Assistant

## 2018-05-27 ENCOUNTER — Other Ambulatory Visit: Payer: Self-pay | Admitting: Cardiovascular Disease

## 2018-05-27 MED ORDER — METOPROLOL TARTRATE 25 MG PO TABS: 12.5000 mg | ORAL_TABLET | Freq: Two times a day (BID) | ORAL | 3 refills | Status: AC

## 2018-05-27 MED ORDER — LISINOPRIL 2.5 MG PO TABS: 2.5000 mg | ORAL_TABLET | Freq: Every day | ORAL | 3 refills | Status: AC

## 2018-05-27 NOTE — Telephone Encounter (Signed)
Pt's medications were sent to pt's pharmacy as requested. Confirmation received.  

## 2018-06-04 ENCOUNTER — Ambulatory Visit (HOSPITAL_COMMUNITY): Payer: Medicare HMO | Attending: Cardiology

## 2018-06-04 ENCOUNTER — Encounter (INDEPENDENT_AMBULATORY_CARE_PROVIDER_SITE_OTHER): Payer: Self-pay

## 2018-06-04 ENCOUNTER — Ambulatory Visit (HOSPITAL_COMMUNITY): Payer: Medicare HMO

## 2018-06-04 ENCOUNTER — Other Ambulatory Visit: Payer: Self-pay

## 2018-06-04 DIAGNOSIS — I10 Essential (primary) hypertension: Secondary | ICD-10-CM | POA: Insufficient documentation

## 2018-06-04 DIAGNOSIS — E785 Hyperlipidemia, unspecified: Secondary | ICD-10-CM | POA: Diagnosis not present

## 2018-06-04 DIAGNOSIS — I081 Rheumatic disorders of both mitral and tricuspid valves: Secondary | ICD-10-CM | POA: Insufficient documentation

## 2018-06-04 DIAGNOSIS — Z953 Presence of xenogenic heart valve: Secondary | ICD-10-CM | POA: Diagnosis not present

## 2018-06-04 DIAGNOSIS — Z09 Encounter for follow-up examination after completed treatment for conditions other than malignant neoplasm: Secondary | ICD-10-CM | POA: Diagnosis not present

## 2018-06-05 ENCOUNTER — Encounter: Payer: Self-pay | Admitting: Physician Assistant

## 2018-06-11 ENCOUNTER — Telehealth (HOSPITAL_COMMUNITY): Payer: Self-pay

## 2018-06-11 NOTE — Telephone Encounter (Signed)
Attempted to call patient in regards to Cardiac Rehab - lm on vm °

## 2018-06-18 NOTE — Progress Notes (Signed)
Cardiology Office Note   Date:  06/29/2018   ID:  Cheyenne Jones, Cheyenne Jones [birth year], MRN [id number]  PCP:  Gaynelle Arabian, MD  Cardiologist:   Jenkins Rouge, MD   No chief complaint on file.     History of Present Illness: Cheyenne Jones is a 77 y.o. female who presents for f/u regarding severe aortic stenosis . Echo from 01/01/18 EF 55-60% mean gradient 52 mmHg, peak 86 mmHg AVA .71 cm2. Progression of disease since 12/31/16  Cath done 02/16/18 with normal coronary arteries   03/20/18 had AVR with Dr Levada Schilling Intuity Elite rapid deployment bovine pericardial tissue valve size 23 mm  No significant post op complications   Post op TTE reviewed  EF 60-65%  Mean gradient 10 mmHg peak 19 mmHg  No significant AR  Had some issues with nursing care after moving out of unit   Past Medical History:  Diagnosis Date  . Adenomatous colon polyp   . Allergy   . Arthritis   . Bronchitis, asthmatic   . Carotid artery disease ([place]) 04/03/2018   Carotid US 03/18/18- Bilateral ICA 40-59  . Diverticulosis   . DJD (degenerative joint disease)   . Hypercholesterolemia   . Hypertension   . Hypothyroidism   . Osteopenia   . S/P aortic valve replacement with bioprosthetic valve 03/20/2018   2/2 Aortic Stenosis // 23 mm Edwards Intuity Elite rapid deployment stented bovine pericardial tissue valve // Echo 6/19: Mild concentric LVH, EF 60-65, normal wall motion, grade 1 diastolic dysfunction, normally functioning bioprosthetic AVR (mean 10, peak 19), mild to moderate MR, mild LAE, moderate TR, PASP 35   . Vitamin D deficiency     Past Surgical History:  Procedure Laterality Date  . AORTIC VALVE REPLACEMENT N/A 03/20/2018   Procedure: AORTIC VALVE REPLACEMENT (AVR);  Surgeon: Rexene Alberts, MD;  Location: [facility];  Service: Open Heart Surgery;  Laterality: N/A;  . BREAST SURGERY Right    x 2  . COLONOSCOPY W/ POLYPECTOMY    . FINGER SURGERY Right    index finger  . MULTIPLE TOOTH EXTRACTIONS     . RIGHT/LEFT HEART CATH AND CORONARY ANGIOGRAPHY N/A 02/16/2018   Procedure: RIGHT/LEFT HEART CATH AND CORONARY ANGIOGRAPHY;  Surgeon: Martinique, Alanni Vader M, MD;  Location: [facility];  Service: Cardiovascular;  Laterality: N/A;  . TEE WITHOUT CARDIOVERSION N/A 03/20/2018   Procedure: TRANSESOPHAGEAL ECHOCARDIOGRAM (TEE);  Surgeon: Rexene Alberts, MD;  Location: [facility];  Service: Open Heart Surgery;  Laterality: N/A;  . TONSILLECTOMY    . TOTAL HIP ARTHROPLASTY Right 09/06/2013   Procedure: TOTAL HIP ARTHROPLASTY;  Surgeon: Kerin Salen, MD;  Location: [facility];  Service: Orthopedics;  Laterality: Right;     Current Outpatient Medications  Medication Sig Dispense Refill  . amoxicillin (AMOXIL) 500 MG capsule Take 2,000 mg by mouth as directed. Prior to dental procedures     . aspirin EC 325 MG tablet Take 1 tablet (325 mg total) by mouth daily. 30 tablet 0  . Calcium Carbonate-Vitamin D (CALCIUM 500 + D PO) Take 2 tablets by mouth daily.    . Cholecalciferol (VITAMIN D) 2000 units CAPS Take 2,000 Units by mouth daily.    Marland Kitchen FIBER SELECT GUMMIES CHEW Chew 1 tablet by mouth daily.     Marland Kitchen ibuprofen (ADVIL,MOTRIN) 200 MG tablet Take 200-400 mg by mouth daily as needed for headache or moderate pain.     Marland Kitchen levothyroxine (SYNTHROID, LEVOTHROID) 100 MCG  tablet Take 100 mcg by mouth daily before breakfast.    . lisinopril (PRINIVIL,ZESTRIL) 2.5 MG tablet Take 1 tablet (2.5 mg total) by mouth daily. 90 tablet 3  . metoprolol tartrate (LOPRESSOR) 25 MG tablet Take 0.5 tablets (12.5 mg total) by mouth 2 (two) times daily. 90 tablet 3  . Multiple Vitamins-Minerals (MULTIVITAMIN PO) Take 1 tablet by mouth daily.     No current facility-administered medications for this visit.     Allergies:   Fenofibrate; Keflex [cephalexin]; Livalo [pitavastatin]; Lovastatin; and Pravastatin    Social History:  The patient  reports that she has never smoked. She has never used smokeless tobacco. She reports that she  drinks about 1.2 oz of alcohol per week. She reports that she does not use drugs.   Family History:  The patient's family history includes Colon polyps in her brother; Ovarian cancer in her sister.    ROS:  Please see the history of present illness.   Otherwise, review of systems are positive for none.   All other systems are reviewed and negative.    PHYSICAL EXAM: VS:  BP (!) 148/76   Pulse 69   Ht 5\' 3"  (1.6 m)   Wt 152 lb (68.9 kg)   SpO2 98%   BMI 26.93 kg/m  , BMI Body mass index is 26.93 kg/m. Affect appropriate Healthy:  appears stated age 53: normal Neck supple with no adenopathy JVP normal no bruits no thyromegaly Lungs clear with no wheezing and good diaphragmatic motion Heart:  S1/S2 SEM through bioprosthetic valve  no AR post sternotomy  Abdomen: benighn, BS positve, no tenderness, no AAA no bruit.  No HSM or HJR Distal pulses intact with no bruits No edema Neuro non-focal Skin warm and dry No muscular weakness    EKG:  09/13/13 SR rate 68 nonspecific lateral ST changes    Recent Labs: 02/12/2018: TSH 0.835 03/18/2018: ALT 25 03/21/2018: Magnesium 2.2 04/03/2018: BUN 17; Creatinine, Ser 0.73; Hemoglobin 10.7; Platelets 482; Potassium 4.5; Sodium 139    Lipid Panel No results found for: CHOL, TRIG, HDL, CHOLHDL, VLDL, LDLCALC, LDLDIRECT    Wt Readings from Last 3 Encounters:  06/29/18 152 lb (68.9 kg)  06/22/18 150 lb 3.2 oz (68.1 kg)  04/20/18 146 lb (66.2 kg)      Other studies Reviewed: Additional studies/ records that were reviewed today include: Notes from [facility] primary care echo 2018 and 2019.    ASSESSMENT AND PLAN:  1.  Critical Aortic Stenosis  Post AVR 23 mm bioprosthetic Edwards Intuity valve post op echo reasonable gradients and no AR preserved EF   2. Thyroid on replacement normal TSH   3. HTN;  Well controlled.  Continue current medications and low sodium Dash type diet.      Current medicines are reviewed at length with the  patient today.  The patient does not have concerns regarding medicines.  The following changes have been made:  no change  Labs/ tests ordered today include:   No orders of the defined types were placed in this encounter.    Disposition:   FU in 6 months     Signed, Jenkins Rouge, MD  06/29/2018 9:47 AM    [facility] [address], [place], [place]  [address] Phone: [phone]; Fax: [phone]

## 2018-06-22 ENCOUNTER — Ambulatory Visit: Payer: Medicare HMO | Admitting: Thoracic Surgery (Cardiothoracic Vascular Surgery)

## 2018-06-22 ENCOUNTER — Other Ambulatory Visit: Payer: Self-pay

## 2018-06-22 ENCOUNTER — Encounter: Payer: Self-pay | Admitting: Thoracic Surgery (Cardiothoracic Vascular Surgery)

## 2018-06-22 VITALS — BP 132/75 | HR 73 | Resp 18 | Ht 63.0 in | Wt 150.2 lb

## 2018-06-22 DIAGNOSIS — Z953 Presence of xenogenic heart valve: Secondary | ICD-10-CM

## 2018-06-22 NOTE — Progress Notes (Signed)
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  The patient's early postoperative recovery was uneventful and she was discharged from the hospital on the fourth postoperative day.  She was last seen in our office on April 20, 2018 at which time she was doing well.  She returns her office today and reports that she is doing very well.  She never did enroll in the outpatient cardiac rehab program.  She states that she is getting along fine but she does not exercise on a regular basis.  She notes that her breathing seems to be better than it was prior to surgery.  She no longer has any soreness or pain in her chest.  Her activity level is good.  Overall she is pleased   Current Outpatient Medications  Medication Sig Dispense Refill  . amoxicillin (AMOXIL) 500 MG capsule Take 2,000 mg by mouth as directed. Prior to dental procedures     . aspirin EC 325 MG tablet Take 1 tablet (325 mg total) by mouth daily. 30 tablet 0  . Calcium Carbonate-Vitamin D (CALCIUM 500 + D PO) Take 2 tablets by mouth daily.    . Cholecalciferol (VITAMIN D) 2000 units CAPS Take 2,000 Units by mouth daily.    Marland Kitchen FIBER SELECT GUMMIES CHEW Chew 1 tablet by mouth daily.     Marland Kitchen ibuprofen (ADVIL,MOTRIN) 200 MG tablet Take 200-400 mg by mouth daily as needed for headache or moderate pain.     Marland Kitchen levothyroxine (SYNTHROID, LEVOTHROID) 100 MCG tablet Take 100 mcg by mouth daily before breakfast.    . lisinopril (PRINIVIL,ZESTRIL) 2.5  MG tablet Take 1 tablet (2.5 mg total) by mouth daily. 90 tablet 3  . metoprolol tartrate (LOPRESSOR) 25 MG tablet Take 0.5 tablets (12.5 mg total) by mouth 2 (two) times daily. 90 tablet 3  . Multiple Vitamins-Minerals (MULTIVITAMIN PO) Take 1 tablet by mouth daily.     No current facility-administered medications for this visit.       Physical Exam:   BP 132/75 (BP Location: Right Arm, Patient Position: Sitting, Cuff Size: Normal)   Pulse 73   Resp 18   Ht 5\' 3"  (1.6 m)   Wt 150 lb 3.2 oz (68.1 kg)   SpO2 94% Comment: RA  BMI 26.61 kg/m   General:  Well-appearing  Chest:   Clear to auscultation  CV:   Regular rate and rhythm  Incisions:  Completely healed, sternum is stable  Abdomen:  Soft nontender  Extremities:  Warm and well-perfused  Diagnostic Tests:  Transthoracic Echocardiography  Patient:    Cheyenne Jones, Cheyenne Jones MR #:       [id number] Study Date: 06/04/2018 Gender:     F Age:        33 Height:     160 cm Weight:     66.2 kg BSA:        1.73 m^2 Pt. Status: Room:   ATTENDING    Kathlen Mody,  Scott T  Faylene Million, Scott T  REFERRING    Ivey, Scott T  SONOGRAPHER  Wyatt Mage, RDCS  PERFORMING   [facility], Outpatient  cc:  ------------------------------------------------------------------- LV EF: 60% -   65%  ------------------------------------------------------------------- Indications:      AVR (Z95.3).  ------------------------------------------------------------------- History:   PMH:   Aortic valve disease.  Risk factors: Hypertension. Dyslipidemia.  ------------------------------------------------------------------- Study Conclusions  - Left ventricle: The cavity size was normal. There was mild   concentric hypertrophy. Systolic function was normal. The   estimated ejection fraction was in the range of 60% to 65%. Wall   motion was normal; there were no regional wall motion   abnormalities. Doppler parameters are consistent with  abnormal   left ventricular relaxation (grade 1 diastolic dysfunction).   There was no evidence of elevated ventricular filling pressure by   Doppler parameters. - Aortic valve: S/P AVR with a bioprosthetic valve. Transvalvular   velocity was within the normal range. There was no stenosis. Mean   gradient (S): 10 mm Hg. Peak gradient (S): 19 mm Hg. - Mitral valve: There was mild to moderate regurgitation. - Left atrium: The atrium was mildly dilated. - Right ventricle: The cavity size was normal. Wall thickness was   normal. Systolic function was normal. - Tricuspid valve: There was moderate regurgitation. - Pulmonary arteries: Systolic pressure was mildly increased. PA   peak pressure: 35 mm Hg (S).  Impressions:  - S/P AVR with a bioprosthetic valve. Normal transaortic gradients,   no paravalvular leak. Mild pulmonary hypertension.  ------------------------------------------------------------------- Labs, prior tests, procedures, and surgery: Transthoracic echocardiography (01/01/2018).    The aortic valve showed critical stenosis.  EF was 55%. Aortic valve: peak gradient of 86 mm Hg and mean gradient of 52 mm Hg.  Transesophageal echocardiography (03/20/2018).     EF was 60%.  ------------------------------------------------------------------- Study data:  Comparison was made to the study of 03/20/2018.  Study status:  Routine.  Procedure:  The patient reported no pain pre or post test. Transthoracic echocardiography. Image quality was adequate.  Study completion:  There were no complications. Transthoracic echocardiography.  M-mode, complete 2D, 3D, spectral Doppler, and color Doppler.  Birthdate:  Patient birthdate: 1941-02-05.  Age:  Patient is 77 yr old.  Sex:  Gender: female. BMI: 25.9 kg/m^2.  Blood pressure:     117/72  Patient status: Outpatient.  Study date:  Study date: 06/04/2018. Study time: 01:54 PM.  Location:  [facility]  3  -------------------------------------------------------------------  ------------------------------------------------------------------- Left ventricle:  The cavity size was normal. There was mild concentric hypertrophy. Systolic function was normal. The estimated ejection fraction was in the range of 60% to 65%. Wall motion was normal; there were no regional wall motion abnormalities. Doppler parameters are consistent with abnormal left ventricular relaxation (grade 1 diastolic dysfunction). There was no evidence of elevated ventricular filling pressure by Doppler parameters.  ------------------------------------------------------------------- Aortic valve:  S/P AVR with a bioprosthetic valve. Mobility was not restricted.  Doppler:  Transvalvular velocity was within the normal range. There was no stenosis. There was no regurgitation.    VTI ratio of LVOT to aortic valve: 0.63. Peak velocity ratio of LVOT to aortic valve: 0.61. Mean velocity ratio of LVOT to aortic valve: 0.63.    Mean gradient (S): 10 mm Hg. Peak gradient (S): 19 mm Hg.   ------------------------------------------------------------------- Aorta:  Aortic root: The aortic root was normal in size.  ------------------------------------------------------------------- Mitral valve:   Structurally normal valve.   Mobility  was not restricted.  Doppler:  Transvalvular velocity was within the normal range. There was no evidence for stenosis. There was mild to moderate regurgitation.    Valve area by pressure half-time: 3.14 cm^2. Indexed valve area by pressure half-time: 1.82 cm^2/m^2. Peak gradient (D): 3 mm Hg.  ------------------------------------------------------------------- Left atrium:  The atrium was mildly dilated.  ------------------------------------------------------------------- Right ventricle:  The cavity size was normal. Wall thickness was normal. Systolic function was  normal.  ------------------------------------------------------------------- Pulmonic valve:    Structurally normal valve.   Cusp separation was normal.  Doppler:  Transvalvular velocity was within the normal range. There was no evidence for stenosis. There was no regurgitation.  ------------------------------------------------------------------- Tricuspid valve:   Structurally normal valve.    Doppler: Transvalvular velocity was within the normal range. There was moderate regurgitation.  ------------------------------------------------------------------- Pulmonary artery:   The main pulmonary artery was normal-sized. Systolic pressure was mildly increased.  ------------------------------------------------------------------- Right atrium:  The atrium was normal in size.  ------------------------------------------------------------------- Pericardium:  There was no pericardial effusion.  ------------------------------------------------------------------- Systemic veins: Inferior vena cava: The vessel was normal in size.  ------------------------------------------------------------------- Measurements   Left ventricle                         Value          Reference  LV ID, ED, PLAX chordal                48    mm       43 - 52  LV ID, ES, PLAX chordal                34    mm       23 - 38  LV fx shortening, PLAX chordal         29    %        >=29  LV PW thickness, ED                    11    mm       ---------  IVS/LV PW ratio, ED                    0.91           <=1.3  LV e&', lateral                         9.03  cm/s     ---------  LV E/e&', lateral                       9.76           ---------  LV e&', medial                          6.85  cm/s     ---------  LV E/e&', medial                        12.86          ---------  LV e&', average                         7.94  cm/s     ---------  LV E/e&', average  11.1           ---------     Ventricular septum                     Value          Reference  IVS thickness, ED                      10    mm       ---------    LVOT                                   Value          Reference  LVOT peak velocity, S                  132   cm/s     ---------  LVOT mean velocity, S                  92.8  cm/s     ---------  LVOT VTI, S                            27.7  cm       ---------  LVOT peak gradient, S                  7     mm Hg    ---------    Aortic valve                           Value          Reference  Aortic valve peak velocity, S          218   cm/s     ---------  Aortic valve mean velocity, S          147   cm/s     ---------  Aortic valve VTI, S                    44    cm       ---------  Aortic mean gradient, S                10    mm Hg    ---------  Aortic peak gradient, S                19    mm Hg    ---------  VTI ratio, LVOT/AV                     0.63           ---------  Velocity ratio, peak, LVOT/AV          0.61           ---------  Velocity ratio, mean, LVOT/AV          0.63           ---------    Aorta                                  Value          Reference  Aortic root ID, ED  30    mm       ---------    Left atrium                            Value          Reference  LA ID, A-P, ES                         42    mm       ---------  LA ID/bsa, A-P                 (H)     2.43  cm/m^2   <=2.2  LA volume, S                           60.3  ml       ---------  LA volume/bsa, S                       34.9  ml/m^2   ---------  LA volume, ES, 1-p A4C                 64.7  ml       ---------  LA volume/bsa, ES, 1-p A4C             37.4  ml/m^2   ---------  LA volume, ES, 1-p A2C                 51.5  ml       ---------  LA volume/bsa, ES, 1-p A2C             29.8  ml/m^2   ---------    Mitral valve                           Value          Reference  Mitral E-wave peak velocity            88.1  cm/s     ---------  Mitral A-wave peak  velocity            114   cm/s     ---------  Mitral deceleration time       (H)     239   ms       150 - 230  Mitral pressure half-time              70    ms       ---------  Mitral peak gradient, D                3     mm Hg    ---------  Mitral E/A ratio, peak                 0.8            ---------  Mitral valve area, PHT, DP             3.14  cm^2     ---------  Mitral valve area/bsa, PHT, DP         1.82  cm^2/m^2 ---------    Pulmonary arteries                     Value  Reference  PA pressure, S, DP             (H)     35    mm Hg    <=30    Tricuspid valve                        Value          Reference  Tricuspid regurg peak velocity         258   cm/s     ---------  Tricuspid peak RV-RA gradient          27    mm Hg    ---------    Systemic veins                         Value          Reference  Estimated CVP                          8     mm Hg    ---------    Right ventricle                        Value          Reference  TAPSE                                  14.6  mm       ---------  RV pressure, S, DP             (H)     35    mm Hg    <=30  RV s&', lateral, S                      10.4  cm/s     ---------  Legend: (L)  and  (H)  mark values outside specified reference range.  ------------------------------------------------------------------- Prepared and Electronically Authenticated by  Ena Dawley, M.D. 2019-06-06T18:47:56   Impression:  Patient is doing well more than 3 months status post aortic valve replacement using a bioprosthetic tissue valve.  Routine follow-up echocardiogram looks good with normal left ventricular function and normal functioning bioprosthetic tissue valve in the aortic position.  Plan:  We have not recommended any change the patient's current medications, although I have told the patient is okay to decrease her dose of aspirin to 81 mg if desired.  She may resume unrestricted physical activity without any particular  limitations at this time.  The patient has been reminded regarding the importance of dental hygiene and the lifelong need for antibiotic prophylaxis for all dental cleanings and other related invasive procedures.  She will continue to follow-up regularly with Dr. Nolon Lennert.  She will return to our office for routine follow-up next March, approximately 1 year following her surgery.   I spent in excess of 15 minutes during the conduct of this office consultation and >50% of this time involved direct face-to-face encounter with the patient for counseling and/or coordination of their care.    Valentina Gu. Roxy Manns, MD 06/22/2018 2:47 PM

## 2018-06-22 NOTE — Patient Instructions (Addendum)
Continue all previous medications without any changes at this time.  You may decrease your dose of aspirin to 81 mg/day if you like.  You may resume unrestricted physical activity without any particular limitations at this time.  Endocarditis is a potentially serious infection of heart valves or inside lining of the heart.  It occurs more commonly in patients with diseased heart valves (such as patient's with aortic or mitral valve disease) and in patients who have undergone heart valve repair or replacement.  Certain surgical and dental procedures may put you at risk, such as dental cleaning, other dental procedures, or any surgery involving the respiratory, urinary, gastrointestinal tract, gallbladder or prostate gland.   To minimize your chances for develooping endocarditis, maintain good oral health and seek prompt medical attention for any infections involving the mouth, teeth, gums, skin or urinary tract.    Always notify your doctor or dentist about your underlying heart valve condition before having any invasive procedures. You will need to take antibiotics before certain procedures, including all routine dental cleanings or other dental procedures.  Your cardiologist or dentist should prescribe these antibiotics for you to be taken ahead of time.

## 2018-06-29 ENCOUNTER — Telehealth (HOSPITAL_COMMUNITY): Payer: Self-pay

## 2018-06-29 ENCOUNTER — Encounter: Payer: Self-pay | Admitting: Cardiovascular Disease

## 2018-06-29 ENCOUNTER — Ambulatory Visit: Payer: Medicare HMO | Admitting: Cardiovascular Disease

## 2018-06-29 ENCOUNTER — Encounter (INDEPENDENT_AMBULATORY_CARE_PROVIDER_SITE_OTHER): Payer: Self-pay

## 2018-06-29 VITALS — BP 148/76 | HR 69 | Ht 63.0 in | Wt 152.0 lb

## 2018-06-29 DIAGNOSIS — I10 Essential (primary) hypertension: Secondary | ICD-10-CM | POA: Diagnosis not present

## 2018-06-29 DIAGNOSIS — Z953 Presence of xenogenic heart valve: Secondary | ICD-10-CM | POA: Diagnosis not present

## 2018-06-29 DIAGNOSIS — E7800 Pure hypercholesterolemia, unspecified: Secondary | ICD-10-CM | POA: Diagnosis not present

## 2018-06-29 NOTE — Telephone Encounter (Signed)
Called patient to see if she is now interested in Cardiac Rehab - Patient stated she cannot participate in the program. Closed referral.

## 2018-06-29 NOTE — Patient Instructions (Signed)

## 2018-09-17 ENCOUNTER — Other Ambulatory Visit: Payer: Self-pay

## 2018-09-17 ENCOUNTER — Emergency Department (HOSPITAL_COMMUNITY): Payer: Medicare HMO

## 2018-09-17 ENCOUNTER — Emergency Department (HOSPITAL_COMMUNITY)
Admission: EM | Admit: 2018-09-17 | Discharge: 2018-09-17 | Disposition: A | Discharge status: Home / Self Care | Payer: Medicare HMO | Attending: Emergency Medicine | Admitting: Emergency Medicine

## 2018-09-17 ENCOUNTER — Encounter (HOSPITAL_COMMUNITY): Payer: Self-pay | Admitting: Emergency Medicine

## 2018-09-17 DIAGNOSIS — S60221A Contusion of right hand, initial encounter: Secondary | ICD-10-CM | POA: Diagnosis not present

## 2018-09-17 DIAGNOSIS — I10 Essential (primary) hypertension: Secondary | ICD-10-CM | POA: Diagnosis not present

## 2018-09-17 DIAGNOSIS — Z79899 Other long term (current) drug therapy: Secondary | ICD-10-CM | POA: Insufficient documentation

## 2018-09-17 DIAGNOSIS — E7800 Pure hypercholesterolemia, unspecified: Secondary | ICD-10-CM | POA: Diagnosis not present

## 2018-09-17 DIAGNOSIS — Y929 Unspecified place or not applicable: Secondary | ICD-10-CM | POA: Insufficient documentation

## 2018-09-17 DIAGNOSIS — E039 Hypothyroidism, unspecified: Secondary | ICD-10-CM | POA: Insufficient documentation

## 2018-09-17 DIAGNOSIS — Z7982 Long term (current) use of aspirin: Secondary | ICD-10-CM | POA: Insufficient documentation

## 2018-09-17 DIAGNOSIS — Y9301 Activity, walking, marching and hiking: Secondary | ICD-10-CM | POA: Insufficient documentation

## 2018-09-17 DIAGNOSIS — S060X0A Concussion without loss of consciousness, initial encounter: Secondary | ICD-10-CM | POA: Insufficient documentation

## 2018-09-17 DIAGNOSIS — W0110XA Fall on same level from slipping, tripping and stumbling with subsequent striking against unspecified object, initial encounter: Secondary | ICD-10-CM | POA: Diagnosis not present

## 2018-09-17 DIAGNOSIS — R51 Headache: Secondary | ICD-10-CM | POA: Diagnosis not present

## 2018-09-17 DIAGNOSIS — M542 Cervicalgia: Secondary | ICD-10-CM | POA: Diagnosis not present

## 2018-09-17 DIAGNOSIS — Y998 Other external cause status: Secondary | ICD-10-CM | POA: Insufficient documentation

## 2018-09-17 DIAGNOSIS — S161XXA Strain of muscle, fascia and tendon at neck level, initial encounter: Secondary | ICD-10-CM | POA: Diagnosis not present

## 2018-09-17 DIAGNOSIS — W19XXXA Unspecified fall, initial encounter: Secondary | ICD-10-CM | POA: Diagnosis not present

## 2018-09-17 DIAGNOSIS — S199XXA Unspecified injury of neck, initial encounter: Secondary | ICD-10-CM | POA: Diagnosis not present

## 2018-09-17 DIAGNOSIS — S0990XA Unspecified injury of head, initial encounter: Secondary | ICD-10-CM | POA: Diagnosis not present

## 2018-09-17 NOTE — ED Notes (Signed)
Patient verbalizes understanding of discharge instructions. Opportunity for questioning and answers were provided. Ambulatory in NAD.

## 2018-09-17 NOTE — ED Provider Notes (Signed)
Patient placed in Quick Look pathway, seen and evaluated   Chief Complaint: headache and neck pain  HPI:  Cheyenne Jones is a 77 y.o. female who presents to the ED with head and neck pain s/p fall. Pt reports having a fall on Tuesday, she reports she slipped on acorns. Patient reports she fell to her R side, hit R side of face on pavement. Pt reports she has been having headaches everyday since the fall. Pt states she feels pressure to her head anytime she bends over or sneezes. Pt was sent over by [facility] clinic.   ROS: M/S: neck pain  Neuro: headache  Physical Exam:  BP (!) 142/57   Pulse 77   Temp 98.3 F (36.8 C)   Resp 20   Ht 5' 3.5" (1.613 m)   Wt 70.3 kg   SpO2 99%   BMI 27.03 kg/m    Gen: No distress  Neuro: Awake and Alert  Skin: Warm and dry  Neck: tender with palpation and movement over c/spine   Initiation of care has begun. The patient has been counseled on the process, plan, and necessity for staying for the completion/evaluation, and the remainder of the medical screening examination    Ashley Murrain, NP 09/17/18 [place]    Quintella Reichert, MD 09/18/18 [phone]

## 2018-09-17 NOTE — ED Provider Notes (Signed)
[facility] EMERGENCY DEPARTMENT Provider Note   CSN: [id number] Arrival date & time: 09/17/18  1626     History   Chief Complaint Chief Complaint  Patient presents with  . Fall  . Migraine    HPI Cheyenne Jones is a 77 y.o. female.  The history is provided by the patient. No language interpreter was used.   Cheyenne Jones is a 77 y.o. female who presents to the Emergency Department complaining of headache. She presents for evaluation of headache. Two days ago she was walking and tripped over acorns and fell forward, striking her head, her knee and her wrist. She felt stunned and is unsure if she passed out at this time. Since that time she reports by frontal headache that is worse when she bends forward. No vomiting. She also endorses pain to her wrist that is overall improving. She has some neck soreness as well. She does take aspirin. She was evaluated in urgent care and referred to the emergency department for further evaluation. Past Medical History:  Diagnosis Date  . Adenomatous colon polyp   . Allergy   . Arthritis   . Bronchitis, asthmatic   . Carotid artery disease ([place]) 04/03/2018   Carotid US 03/18/18- Bilateral ICA 40-59  . Diverticulosis   . DJD (degenerative joint disease)   . Hypercholesterolemia   . Hypertension   . Hypothyroidism   . Osteopenia   . S/P aortic valve replacement with bioprosthetic valve 03/20/2018   2/2 Aortic Stenosis // 23 mm Edwards Intuity Elite rapid deployment stented bovine pericardial tissue valve // Echo 6/19: Mild concentric LVH, EF 60-65, normal wall motion, grade 1 diastolic dysfunction, normally functioning bioprosthetic AVR (mean 10, peak 19), mild to moderate MR, mild LAE, moderate TR, PASP 35   . Vitamin D deficiency     Patient Active Problem List   Diagnosis Date Noted  . Carotid artery disease ([place]) 04/03/2018  . Hypercholesterolemia   . S/P aortic valve replacement with bioprosthetic valve 03/20/2018  .  Essential hypertension, benign 09/13/2013  . Unspecified hypothyroidism 09/13/2013  . Unspecified vitamin D deficiency 09/13/2013  . Hip arthritis Right 09/05/2013    Past Surgical History:  Procedure Laterality Date  . AORTIC VALVE REPLACEMENT N/A 03/20/2018   Procedure: AORTIC VALVE REPLACEMENT (AVR);  Surgeon: Rexene Alberts, MD;  Location: [facility];  Service: Open Heart Surgery;  Laterality: N/A;  . BREAST SURGERY Right    x 2  . COLONOSCOPY W/ POLYPECTOMY    . FINGER SURGERY Right    index finger  . MULTIPLE TOOTH EXTRACTIONS    . RIGHT/LEFT HEART CATH AND CORONARY ANGIOGRAPHY N/A 02/16/2018   Procedure: RIGHT/LEFT HEART CATH AND CORONARY ANGIOGRAPHY;  Surgeon: Martinique, Peter M, MD;  Location: [facility];  Service: Cardiovascular;  Laterality: N/A;  . TEE WITHOUT CARDIOVERSION N/A 03/20/2018   Procedure: TRANSESOPHAGEAL ECHOCARDIOGRAM (TEE);  Surgeon: Rexene Alberts, MD;  Location: [facility];  Service: Open Heart Surgery;  Laterality: N/A;  . TONSILLECTOMY    . TOTAL HIP ARTHROPLASTY Right 09/06/2013   Procedure: TOTAL HIP ARTHROPLASTY;  Surgeon: Kerin Salen, MD;  Location: [facility];  Service: Orthopedics;  Laterality: Right;     OB History   None      Home Medications    Prior to Admission medications   Medication Sig Start Date End Date Taking? Authorizing Provider  amoxicillin (AMOXIL) 500 MG capsule Take 2,000 mg by mouth as directed. Prior to dental procedures  [provider]  aspirin EC 325 MG tablet Take 1 tablet (325 mg total) by mouth daily. 04/03/18   Richardson Dopp T, PA-C  Calcium Carbonate-Vitamin D (CALCIUM 500 + D PO) Take 2 tablets by mouth daily.    [provider]  Cholecalciferol (VITAMIN D) 2000 units CAPS Take 2,000 Units by mouth daily.    [provider]  FIBER SELECT GUMMIES CHEW Chew 1 tablet by mouth daily.     [provider]  ibuprofen (ADVIL,MOTRIN) 200 MG tablet Take 200-400 mg by mouth daily as needed for  headache or moderate pain.     [provider]  levothyroxine (SYNTHROID, LEVOTHROID) 100 MCG tablet Take 100 mcg by mouth daily before breakfast.    [provider]  lisinopril (PRINIVIL,ZESTRIL) 2.5 MG tablet Take 1 tablet (2.5 mg total) by mouth daily. 05/27/18   Josue Hector, MD  metoprolol tartrate (LOPRESSOR) 25 MG tablet Take 0.5 tablets (12.5 mg total) by mouth 2 (two) times daily. 05/27/18   Josue Hector, MD  Multiple Vitamins-Minerals (MULTIVITAMIN PO) Take 1 tablet by mouth daily.    [provider]    Family History Family History  Problem Relation Age of Onset  . Ovarian cancer Sister   . Colon polyps Brother     Social History Social History   Tobacco Use  . Smoking status: Never Smoker  . Smokeless tobacco: Never Used  Substance Use Topics  . Alcohol use: Yes    Alcohol/week: 2.0 standard drinks    Types: 2 Glasses of wine per week    Comment: occasional  . Drug use: No     Allergies   Fenofibrate; Keflex [cephalexin]; Livalo [pitavastatin]; Lovastatin; and Pravastatin   Review of Systems Review of Systems  All other systems reviewed and are negative.    Physical Exam Updated Vital Signs BP 138/68   Pulse 80   Temp 98.3 F (36.8 C)   Resp 18   Ht 5' 3.5" (1.613 m)   Wt 70.3 kg   SpO2 98%   BMI 27.03 kg/m   Physical Exam  Constitutional: She is oriented to person, place, and time. She appears well-developed and well-nourished.  HENT:  Head: Normocephalic and atraumatic.  Mild tenderness to palpation over the right maxillary face without any ecchymosis or swelling. EOMI.  Cardiovascular: Normal rate and regular rhythm.  No murmur heard. Pulmonary/Chest: Effort normal and breath sounds normal. No respiratory distress.  Abdominal: Soft. There is no tenderness. There is no rebound and no guarding.  Musculoskeletal: She exhibits no edema.  Ecchymosis over the right thenar eminence with minimal tenderness. Range of  motion intact throughout the right wrist and thumb.  Neurological: She is alert and oriented to person, place, and time.  No facial asymmetry. Five out of five strength in all four extremities.  Skin: Skin is warm and dry.  Psychiatric: She has a normal mood and affect. Her behavior is normal.  Nursing note and vitals reviewed.    ED Treatments / Results  Labs (all labs ordered are listed, but only abnormal results are displayed) Labs Reviewed - No data to display  EKG None  Radiology Ct Head Wo Contrast  Result Date: 09/17/2018 CLINICAL DATA:  Headache and neck pain following trauma EXAM: CT HEAD WITHOUT CONTRAST CT CERVICAL SPINE WITHOUT CONTRAST TECHNIQUE: Multidetector CT imaging of the head and cervical spine was performed following the standard protocol without intravenous contrast. Multiplanar CT image reconstructions of the cervical spine were also  generated. COMPARISON:  None. FINDINGS: CT HEAD FINDINGS Brain: There is age related volume loss. There is no intracranial mass, hemorrhage, extra-axial fluid collection, or midline shift. The gray-white compartment regions appear unremarkable. No evident acute infarct. Vascular: No hyperdense vessel. There is calcification in each carotid siphon. There is mild calcification in the distal left vertebral artery as well. Skull: Bony calvarium appears intact. Sinuses/Orbits: There is mucosal thickening in several ethmoid air cells bilaterally. Other visualized paranasal sinuses are clear. Orbits appear symmetric bilaterally. Other: Mastoid air cells are clear. CT CERVICAL SPINE FINDINGS Alignment: There is 2 mm of anterolisthesis of C3 on C4. There is 3 mm of anterolisthesis of C4 on C5. There is no other appreciable spondylolisthesis. There is slight dextroscoliosis in the cervical region. Skull base and vertebrae: Skull base and craniocervical junction regions appear normal. No fracture evident. There are no blastic or lytic bone lesions. Soft  tissues and spinal canal: Prevertebral soft tissues and predental space regions are normal. There are no paraspinous lesions. There is no evident cord or canal hematoma. Disc levels: There is marked disc space narrowing at C5-6 and C6-7. There is milder disc space narrowing at C4-5. There is facet hypertrophy at multiple levels bilaterally. There is exit foraminal narrowing due to bony hypertrophy at multiple levels including at C3-4 on the right, C4-5 on the left, C5-6 bilaterally, and C6-7 bilaterally. There is no frank disc extrusion or high-grade stenosis. Upper chest: Visualized upper lung zones are clear. Other: There is calcification in both carotid arteries, more on the right than on the left. IMPRESSION: CT head: No mass or hemorrhage. Gray-white compartments appear normal. There are foci of arterial vascular calcification. There is mucosal thickening in several ethmoid air cells. CT cervical spine: No fracture. Areas of mild spondylolisthesis are felt to be due to underlying spondylosis. There is multilevel arthropathy. No frank disc extrusion or high-grade stenosis. There are foci of carotid artery calcification, more notable on the right than on the left. Electronically Signed   By: Lowella Grip III M.D.   On: 09/17/2018 17:56   Ct Cervical Spine Wo Contrast  Result Date: 09/17/2018 CLINICAL DATA:  Headache and neck pain following trauma EXAM: CT HEAD WITHOUT CONTRAST CT CERVICAL SPINE WITHOUT CONTRAST TECHNIQUE: Multidetector CT imaging of the head and cervical spine was performed following the standard protocol without intravenous contrast. Multiplanar CT image reconstructions of the cervical spine were also generated. COMPARISON:  None. FINDINGS: CT HEAD FINDINGS Brain: There is age related volume loss. There is no intracranial mass, hemorrhage, extra-axial fluid collection, or midline shift. The gray-white compartment regions appear unremarkable. No evident acute infarct. Vascular: No  hyperdense vessel. There is calcification in each carotid siphon. There is mild calcification in the distal left vertebral artery as well. Skull: Bony calvarium appears intact. Sinuses/Orbits: There is mucosal thickening in several ethmoid air cells bilaterally. Other visualized paranasal sinuses are clear. Orbits appear symmetric bilaterally. Other: Mastoid air cells are clear. CT CERVICAL SPINE FINDINGS Alignment: There is 2 mm of anterolisthesis of C3 on C4. There is 3 mm of anterolisthesis of C4 on C5. There is no other appreciable spondylolisthesis. There is slight dextroscoliosis in the cervical region. Skull base and vertebrae: Skull base and craniocervical junction regions appear normal. No fracture evident. There are no blastic or lytic bone lesions. Soft tissues and spinal canal: Prevertebral soft tissues and predental space regions are normal. There are no paraspinous lesions. There is no evident cord or canal hematoma. Disc levels: There  is marked disc space narrowing at C5-6 and C6-7. There is milder disc space narrowing at C4-5. There is facet hypertrophy at multiple levels bilaterally. There is exit foraminal narrowing due to bony hypertrophy at multiple levels including at C3-4 on the right, C4-5 on the left, C5-6 bilaterally, and C6-7 bilaterally. There is no frank disc extrusion or high-grade stenosis. Upper chest: Visualized upper lung zones are clear. Other: There is calcification in both carotid arteries, more on the right than on the left. IMPRESSION: CT head: No mass or hemorrhage. Gray-white compartments appear normal. There are foci of arterial vascular calcification. There is mucosal thickening in several ethmoid air cells. CT cervical spine: No fracture. Areas of mild spondylolisthesis are felt to be due to underlying spondylosis. There is multilevel arthropathy. No frank disc extrusion or high-grade stenosis. There are foci of carotid artery calcification, more notable on the right than  on the left. Electronically Signed   By: Lowella Grip III M.D.   On: 09/17/2018 17:56    Procedures Procedures (including critical care time)  Medications Ordered in ED Medications - No data to display   Initial Impression / Assessment and Plan / ED Course  I have reviewed the triage vital signs and the nursing notes.  Pertinent labs & imaging results that were available during my care of the patient were reviewed by me and considered in my medical decision making (see chart for details).     Patient here for evaluation of headache following a mechanical fall two days ago. She is neurovascular intact on examination. Imaging is negative for subdural hematoma, fracture. Discussed with patient home care for concussion. In terms of her hand bruising, patient declines x-ray. Discussed outpatient follow-up as well as return precautions.  Final Clinical Impressions(s) / ED Diagnoses   Final diagnoses:  Fall, initial encounter  Concussion without loss of consciousness, initial encounter  Contusion of right hand, initial encounter  Strain of neck muscle, initial encounter    ED Discharge Orders    None       Quintella Reichert, MD 09/17/18 2354

## 2018-09-17 NOTE — ED Triage Notes (Signed)
Pt reports having a fall on Tuesday, she reports she slipped on acorns. P treports she fell to her R side, hit her R side of face on pavement. Pt reports she has been having headaches everyday since the fall. Pt reports she feels pressure to her head anytime she bends over or sneezes. Pt was sent over by [facility].

## 2018-09-21 DIAGNOSIS — R51 Headache: Secondary | ICD-10-CM | POA: Diagnosis not present

## 2018-09-21 DIAGNOSIS — S060X9A Concussion with loss of consciousness of unspecified duration, initial encounter: Secondary | ICD-10-CM | POA: Diagnosis not present

## 2018-10-06 DIAGNOSIS — M5432 Sciatica, left side: Secondary | ICD-10-CM | POA: Diagnosis not present

## 2018-10-20 DIAGNOSIS — Z23 Encounter for immunization: Secondary | ICD-10-CM | POA: Diagnosis not present

## 2018-10-20 DIAGNOSIS — M5432 Sciatica, left side: Secondary | ICD-10-CM | POA: Diagnosis not present

## 2018-10-27 DIAGNOSIS — M5416 Radiculopathy, lumbar region: Secondary | ICD-10-CM | POA: Diagnosis not present

## 2018-10-27 DIAGNOSIS — M545 Low back pain: Secondary | ICD-10-CM | POA: Diagnosis not present

## 2018-10-29 DIAGNOSIS — M545 Low back pain: Secondary | ICD-10-CM | POA: Diagnosis not present

## 2018-10-29 DIAGNOSIS — M5416 Radiculopathy, lumbar region: Secondary | ICD-10-CM | POA: Diagnosis not present

## 2018-11-10 DIAGNOSIS — R413 Other amnesia: Secondary | ICD-10-CM | POA: Diagnosis not present

## 2018-11-10 DIAGNOSIS — M25562 Pain in left knee: Secondary | ICD-10-CM | POA: Diagnosis not present

## 2018-11-10 DIAGNOSIS — M25552 Pain in left hip: Secondary | ICD-10-CM | POA: Diagnosis not present

## 2018-11-11 ENCOUNTER — Encounter: Payer: Self-pay | Admitting: Neurology

## 2018-11-17 DIAGNOSIS — M25552 Pain in left hip: Secondary | ICD-10-CM | POA: Diagnosis not present

## 2018-11-17 DIAGNOSIS — M1712 Unilateral primary osteoarthritis, left knee: Secondary | ICD-10-CM | POA: Diagnosis not present

## 2018-11-18 ENCOUNTER — Telehealth: Payer: Self-pay | Admitting: Cardiovascular Disease

## 2018-11-18 NOTE — Telephone Encounter (Signed)
New Message        Pt c/o medication issue:  1. Name of Medication: Meloxicam 15 mg  2. How are you currently taking this medication (dosage and times per day)? Have not start taking meds yet.  3. Are you having a reaction (difficulty breathing--STAT)? No  4. What is your medication issue? Patient is concerned about the information she read about the medication

## 2018-11-18 NOTE — Telephone Encounter (Signed)
Patient aware of Dr. Kyla Balzarine recommendations. Patient thanked me for the response.

## 2018-11-18 NOTE — Telephone Encounter (Signed)
Ok to take some meloxicam if it helps her pain

## 2018-11-18 NOTE — Telephone Encounter (Signed)
Called patient back about her question. Patient stated she was not sure about taking Meloxicam 15 mg when she is on Aspirin. Patient stated she is only on ASA 81 mg not 325 mg, updated patient's medication list. Will forward to Dr. Johnsie Cancel for advisement on Patient taking meloxicam.

## 2018-12-29 NOTE — Progress Notes (Signed)
Cardiology Office Note   Date:  01/04/2019   ID:  Cheyenne, Jones Jun 21, 1941, MRN [id number]  PCP:  Gaynelle Arabian, MD  Cardiologist:   Jenkins Rouge, MD   No chief complaint on file.     History of Present Illness: Cheyenne Jones is a 77 y.o. female who presents for f/u regarding severe aortic stenosis . Echo from 01/01/18 EF 55-60% mean gradient 52 mmHg, peak 86 mmHg AVA .71 cm2. Progression of disease since 12/31/16  Cath done 02/16/18 with normal coronary arteries   03/20/18 had AVR with Dr Levada Schilling Intuity Elite rapid deployment bovine pericardial tissue valve size 23 mm  No significant post op complications   Post op TTE reviewed  EF 60-65%  Mean gradient 10 mmHg peak 19 mmHg  No significant AR  Mostly arthritic issues Previous right THR Fell in September and has left sciatica Also both knees are bad Sees Dr Mayer Camel ortho  Past Medical History:  Diagnosis Date  . Adenomatous colon polyp   . Allergy   . Arthritis   . Bronchitis, asthmatic   . Carotid artery disease ([place]) 04/03/2018   Carotid US 03/18/18- Bilateral ICA 40-59  . Diverticulosis   . DJD (degenerative joint disease)   . Hypercholesterolemia   . Hypertension   . Hypothyroidism   . Osteopenia   . S/P aortic valve replacement with bioprosthetic valve 03/20/2018   2/2 Aortic Stenosis // 23 mm Edwards Intuity Elite rapid deployment stented bovine pericardial tissue valve // Echo 6/19: Mild concentric LVH, EF 60-65, normal wall motion, grade 1 diastolic dysfunction, normally functioning bioprosthetic AVR (mean 10, peak 19), mild to moderate MR, mild LAE, moderate TR, PASP 35   . Vitamin D deficiency     Past Surgical History:  Procedure Laterality Date  . AORTIC VALVE REPLACEMENT N/A 03/20/2018   Procedure: AORTIC VALVE REPLACEMENT (AVR);  Surgeon: Rexene Alberts, MD;  Location: [facility];  Service: Open Heart Surgery;  Laterality: N/A;  . BREAST SURGERY Right    x 2  . COLONOSCOPY W/ POLYPECTOMY    .  FINGER SURGERY Right    index finger  . MULTIPLE TOOTH EXTRACTIONS    . RIGHT/LEFT HEART CATH AND CORONARY ANGIOGRAPHY N/A 02/16/2018   Procedure: RIGHT/LEFT HEART CATH AND CORONARY ANGIOGRAPHY;  Surgeon: Martinique, Cordell Guercio M, MD;  Location: [facility];  Service: Cardiovascular;  Laterality: N/A;  . TEE WITHOUT CARDIOVERSION N/A 03/20/2018   Procedure: TRANSESOPHAGEAL ECHOCARDIOGRAM (TEE);  Surgeon: Rexene Alberts, MD;  Location: [facility];  Service: Open Heart Surgery;  Laterality: N/A;  . TONSILLECTOMY    . TOTAL HIP ARTHROPLASTY Right 09/06/2013   Procedure: TOTAL HIP ARTHROPLASTY;  Surgeon: Kerin Salen, MD;  Location: [facility];  Service: Orthopedics;  Laterality: Right;     Current Outpatient Medications  Medication Sig Dispense Refill  . amoxicillin (AMOXIL) 500 MG capsule Take 2,000 mg by mouth as directed. Prior to dental procedures     . aspirin EC 81 MG tablet Take 81 mg by mouth daily.    . Calcium Carbonate-Vitamin D (CALCIUM 500 + D PO) Take 2 tablets by mouth daily.    . Cholecalciferol (VITAMIN D) 2000 units CAPS Take 2,000 Units by mouth daily.    Marland Kitchen FIBER SELECT GUMMIES CHEW Chew 1 tablet by mouth daily.     Marland Kitchen ibuprofen (ADVIL,MOTRIN) 200 MG tablet Take 200-400 mg by mouth daily as needed for headache or moderate pain.     Marland Kitchen  levothyroxine (SYNTHROID, LEVOTHROID) 100 MCG tablet Take 100 mcg by mouth daily before breakfast.    . lisinopril (PRINIVIL,ZESTRIL) 2.5 MG tablet Take 1 tablet (2.5 mg total) by mouth daily. 90 tablet 3  . metoprolol tartrate (LOPRESSOR) 25 MG tablet Take 0.5 tablets (12.5 mg total) by mouth 2 (two) times daily. 90 tablet 3  . Multiple Vitamins-Minerals (MULTIVITAMIN PO) Take 1 tablet by mouth daily.     No current facility-administered medications for this visit.     Allergies:   Fenofibrate; Keflex [cephalexin]; Livalo [pitavastatin]; Lovastatin; and Pravastatin    Social History:  The patient  reports that she has never smoked. She has never used  smokeless tobacco. She reports current alcohol use of about 2.0 standard drinks of alcohol per week. She reports that she does not use drugs.   Family History:  The patient's family history includes Colon polyps in her brother; Ovarian cancer in her sister.    ROS:  Please see the history of present illness.   Otherwise, review of systems are positive for none.   All other systems are reviewed and negative.    PHYSICAL EXAM: VS:  BP 140/68   Pulse 67   Ht 5' 3.5" (1.613 m)   Wt 157 lb (71.2 kg)   SpO2 96%   BMI 27.38 kg/m  , BMI Body mass index is 27.38 kg/m. Affect appropriate Healthy:  appears stated age 74: normal Neck supple with no adenopathy JVP normal no bruits no thyromegaly Lungs clear with no wheezing and good diaphragmatic motion Heart:  S1/S2 SEM through bioprosthetic valve  no AR post sternotomy  Abdomen: benighn, BS positve, no tenderness, no AAA no bruit.  No HSM or HJR Distal pulses intact with no bruits No edema Neuro non-focal Skin warm and dry No muscular weakness Post Right THR     EKG:  09/13/13 SR rate 68 nonspecific lateral ST changes    Recent Labs: 02/12/2018: TSH 0.835 03/18/2018: ALT 25 03/21/2018: Magnesium 2.2 04/03/2018: BUN 17; Creatinine, Ser 0.73; Hemoglobin 10.7; Platelets 482; Potassium 4.5; Sodium 139    Lipid Panel No results found for: CHOL, TRIG, HDL, CHOLHDL, VLDL, LDLCALC, LDLDIRECT    Wt Readings from Last 3 Encounters:  01/04/19 157 lb (71.2 kg)  09/17/18 155 lb (70.3 kg)  06/29/18 152 lb (68.9 kg)      Other studies Reviewed: Additional studies/ records that were reviewed today include: Notes from [facility] primary care echo 2018 and 2019.    ASSESSMENT AND PLAN:  1.  Critical Aortic Stenosis  Post AVR 23 mm bioprosthetic Edwards Intuity valve post op echo reasonable gradients and no AR preserved EF   2. Thyroid on replacement normal TSH   3. HTN;  Well controlled.  Continue current medications and low sodium Dash  type diet.     4. Ortho:  F/u Mayer Camel ok to proceed with TKR if needed    Current medicines are reviewed at length with the patient today.  The patient does not have concerns regarding medicines.  The following changes have been made:  no change  Labs/ tests ordered today include:   No orders of the defined types were placed in this encounter.    Disposition:   FU in 6 months     Signed, Jenkins Rouge, MD  01/04/2019 9:38 AM    [facility] [address], [place], [place]  [address] Phone: [phone]; Fax: [phone]

## 2019-01-04 ENCOUNTER — Encounter: Payer: Self-pay | Admitting: Cardiovascular Disease

## 2019-01-04 ENCOUNTER — Ambulatory Visit: Payer: Medicare HMO | Admitting: Cardiovascular Disease

## 2019-01-04 VITALS — BP 140/68 | HR 67 | Ht 63.5 in | Wt 157.0 lb

## 2019-01-04 DIAGNOSIS — Z952 Presence of prosthetic heart valve: Secondary | ICD-10-CM | POA: Diagnosis not present

## 2019-01-04 NOTE — Patient Instructions (Signed)

## 2019-01-15 DIAGNOSIS — Z1231 Encounter for screening mammogram for malignant neoplasm of breast: Secondary | ICD-10-CM | POA: Diagnosis not present

## 2019-01-19 DIAGNOSIS — H524 Presbyopia: Secondary | ICD-10-CM | POA: Diagnosis not present

## 2019-01-19 DIAGNOSIS — H2513 Age-related nuclear cataract, bilateral: Secondary | ICD-10-CM | POA: Diagnosis not present

## 2019-02-01 ENCOUNTER — Encounter: Payer: Self-pay | Admitting: Neurology

## 2019-02-01 ENCOUNTER — Encounter

## 2019-02-01 ENCOUNTER — Ambulatory Visit (INDEPENDENT_AMBULATORY_CARE_PROVIDER_SITE_OTHER): Payer: Medicare HMO | Admitting: Neurology

## 2019-02-01 ENCOUNTER — Other Ambulatory Visit: Payer: Self-pay

## 2019-02-01 ENCOUNTER — Other Ambulatory Visit: Payer: Medicare HMO

## 2019-02-01 VITALS — BP 128/86 | HR 67 | Ht 63.0 in | Wt 156.0 lb

## 2019-02-01 DIAGNOSIS — G3184 Mild cognitive impairment, so stated: Secondary | ICD-10-CM

## 2019-02-01 DIAGNOSIS — R413 Other amnesia: Secondary | ICD-10-CM

## 2019-02-01 LAB — VITAMIN B12: Vitamin B-12: 495 pg/mL (ref 200–1100)

## 2019-02-01 NOTE — Patient Instructions (Addendum)
1. Bloodwork for B12 level  Your provider requests that you have LABS drawn today.  We share a lab with [facility] - they are located in [address] (second floor) of this building.  Once you get there, please have a seat and the phlebotomist will call your name.  If you have waited more than 15 minutes, please advise the front desk  2. Schedule MRI brain without contrast  We have sent a referral to [facility] for your MRI and they will call you directly to schedule your appt. They are located at [place]. If you need to contact them directly please call [phone].   3. Follow-up in 6 months, call for any changes  RECOMMENDATIONS FOR ALL PATIENTS WITH MEMORY PROBLEMS: 1. Continue to exercise (Recommend 30 minutes of walking everyday, or 3 hours every week) 2. Increase social interactions - continue going to [place] and enjoy social gatherings with friends and family 3. Eat healthy, avoid fried foods and eat more fruits and vegetables 4. Maintain adequate blood pressure, blood sugar, and blood cholesterol level. Reducing the risk of stroke and cardiovascular disease also helps promoting better memory. 5. Avoid stressful situations. Live a simple life and avoid aggravations. Organize your time and prepare for the next day in anticipation. 6. Sleep well, avoid any interruptions of sleep and avoid any distractions in the bedroom that may interfere with adequate sleep quality 7. Avoid sugar, avoid sweets as there is a strong link between excessive sugar intake, diabetes, and cognitive impairment We discussed the Mediterranean diet, which has been shown to help patients reduce the risk of progressive memory disorders and reduces cardiovascular risk. This includes eating fish, eat fruits and green leafy vegetables, nuts like almonds and hazelnuts, walnuts, and also use olive oil. Avoid fast foods and fried foods as much as possible. Avoid sweets and sugar as sugar use has  been linked to worsening of memory function.

## 2019-02-01 NOTE — Progress Notes (Signed)
NEUROLOGY CONSULTATION NOTE  Cheyenne Jones MRN: [id number] DOB: [birth year]  Referring provider: Dr. Gaynelle Arabian Primary care provider: Dr. Gaynelle Arabian  Reason for consult:  Memory loss  Dear Dr Marisue Humble:  Thank you for your kind referral of Cheyenne Jones for consultation of the above symptoms. Although her history is well known to you, please allow me to reiterate it for the purpose of our medical record. The patient was accompanied to the clinic by her daughter Cheyenne Jones who also provides collateral information. Records and images were personally reviewed where available.   HISTORY OF PRESENT ILLNESS: This is a 78 year old woman with a history of hypertension, hyperlipidemia, hypothyroidism, aortic stenosis s/p aortic valve replacement in March 2019, presenting for evaluation of worsening memory. She states "when you get older, you forget things." She would be talking about something and forget a name, or book/movie title. It comes back to her later. She lives with her husband and son and manages bills without difficulties. She is usually good with taking her medications. She denies getting lost driving. Her daughter who lives out of state started noticing changes after her heart surgery in March 2019, she would repeat herself. She slipped on the ground in September 2019 and hit the back of her head, no loss of consciousness but had headaches after. She went to the ER 2 days later and had a head CT without contrast which I personally reviewed, no acute changes, there was mild diffuse atrophy. She had an incident around that time where she put leftover food in a container in the cabinet and forgot about it. It started to smell bad and she could not find it, until she went to use the container later on. This incident concerned her daughter. In the past she left the dish rag in the fridge. She denies leaving the stove or on faucet running. She is independent with dressing and bathing, no  hygiene concerns. She has told her daughter "sometimes I think I'm losing my mind." Her daughter denies any paranoia or hallucinations. Cheyenne Jones has noticed that she gets anxious when someone else is driving and would scream out that they would hit a car. No family history of dementia. She rarely drinks alcohol. No other history of concussions.   She had headaches after her fall in September, this has improved. She has dizziness when bending down. No diplopia, dysarthria/dysphagia, focal weakness, bowel/bladder dysfunction, anosmia, or tremors. She has neck pain when tired or stressed out. She has occasional tingling in both feet. She has had back pain with pain radiating down her left leg since the fall, reporting this is better. She does home PT exercises when pain starts to worsen. She reports poor sleep for many years. She has been told she snores and has woken herself up with the snoring. She would get drowsy during the day when not stimulated.   Laboratory Data: TSH at PCP office normal.  PAST MEDICAL HISTORY: Past Medical History:  Diagnosis Date  . Adenomatous colon polyp   . Allergy   . Arthritis   . Bronchitis, asthmatic   . Carotid artery disease ([place]) 04/03/2018   Carotid US 03/18/18- Bilateral ICA 40-59  . Diverticulosis   . DJD (degenerative joint disease)   . Hypercholesterolemia   . Hypertension   . Hypothyroidism   . Osteopenia   . S/P aortic valve replacement with bioprosthetic valve 03/20/2018   2/2 Aortic Stenosis // 23 mm Edwards Intuity Elite rapid deployment stented bovine pericardial  tissue valve // Echo 6/19: Mild concentric LVH, EF 60-65, normal wall motion, grade 1 diastolic dysfunction, normally functioning bioprosthetic AVR (mean 10, peak 19), mild to moderate MR, mild LAE, moderate TR, PASP 35   . Vitamin D deficiency     PAST SURGICAL HISTORY: Past Surgical History:  Procedure Laterality Date  . AORTIC VALVE REPLACEMENT N/A 03/20/2018   Procedure: AORTIC VALVE  REPLACEMENT (AVR);  Surgeon: Rexene Alberts, MD;  Location: [facility];  Service: Open Heart Surgery;  Laterality: N/A;  . BREAST SURGERY Right    x 2  . COLONOSCOPY W/ POLYPECTOMY    . FINGER SURGERY Right    index finger  . MULTIPLE TOOTH EXTRACTIONS    . RIGHT/LEFT HEART CATH AND CORONARY ANGIOGRAPHY N/A 02/16/2018   Procedure: RIGHT/LEFT HEART CATH AND CORONARY ANGIOGRAPHY;  Surgeon: Martinique, Peter M, MD;  Location: [facility];  Service: Cardiovascular;  Laterality: N/A;  . TEE WITHOUT CARDIOVERSION N/A 03/20/2018   Procedure: TRANSESOPHAGEAL ECHOCARDIOGRAM (TEE);  Surgeon: Rexene Alberts, MD;  Location: [facility];  Service: Open Heart Surgery;  Laterality: N/A;  . TONSILLECTOMY    . TOTAL HIP ARTHROPLASTY Right 09/06/2013   Procedure: TOTAL HIP ARTHROPLASTY;  Surgeon: Kerin Salen, MD;  Location: [facility];  Service: Orthopedics;  Laterality: Right;    MEDICATIONS: Current Outpatient Medications on File Prior to Visit  Medication Sig Dispense Refill  . amoxicillin (AMOXIL) 500 MG capsule Take 2,000 mg by mouth as directed. Prior to dental procedures     . aspirin EC 81 MG tablet Take 81 mg by mouth daily.    . Calcium Carbonate-Vitamin D (CALCIUM 500 + D PO) Take 2 tablets by mouth daily.    . Cholecalciferol (VITAMIN D) 2000 units CAPS Take 2,000 Units by mouth daily.    Marland Kitchen FIBER SELECT GUMMIES CHEW Chew 1 tablet by mouth daily.     Marland Kitchen ibuprofen (ADVIL,MOTRIN) 200 MG tablet Take 200-400 mg by mouth daily as needed for headache or moderate pain.     Marland Kitchen levothyroxine (SYNTHROID, LEVOTHROID) 100 MCG tablet Take 100 mcg by mouth daily before breakfast.    . lisinopril (PRINIVIL,ZESTRIL) 2.5 MG tablet Take 1 tablet (2.5 mg total) by mouth daily. 90 tablet 3  . metoprolol tartrate (LOPRESSOR) 25 MG tablet Take 0.5 tablets (12.5 mg total) by mouth 2 (two) times daily. 90 tablet 3  . Multiple Vitamins-Minerals (MULTIVITAMIN PO) Take 1 tablet by mouth daily.     No current facility-administered  medications on file prior to visit.     ALLERGIES: Allergies  Allergen Reactions  . Fenofibrate Other (See Comments)    LEG ACHES, 09/2012, SIDE EFFECTS  . Keflex [Cephalexin] Other (See Comments)    LEG ACHES, 09/2012, SIDE EFFECTS  . Livalo [Pitavastatin] Other (See Comments)    LEG ACHES  . Lovastatin Other (See Comments)    LEG ACHES, SIDE EFFECTS  . Pravastatin Other (See Comments)    LEG ACHES, SIDE EFFECTS    FAMILY HISTORY: Family History  Problem Relation Age of Onset  . Ovarian cancer Sister   . Colon polyps Brother     SOCIAL HISTORY: Social History   Socioeconomic History  . Marital status: Married    Spouse name: Not on file  . Number of children: Not on file  . Years of education: Not on file  . Highest education level: Not on file  Occupational History  . Occupation: RETIRED  Social Needs  . Financial resource strain: Not  on file  . Food insecurity:    Worry: Not on file    Inability: Not on file  . Transportation needs:    Medical: Not on file    Non-medical: Not on file  Tobacco Use  . Smoking status: Never Smoker  . Smokeless tobacco: Never Used  Substance and Sexual Activity  . Alcohol use: Yes    Alcohol/week: 2.0 standard drinks    Types: 2 Glasses of wine per week    Comment: occasional  . Drug use: No  . Sexual activity: Not on file  Lifestyle  . Physical activity:    Days per week: Not on file    Minutes per session: Not on file  . Stress: Not on file  Relationships  . Social connections:    Talks on phone: Not on file    Gets together: Not on file    Attends religious service: Not on file    Active member of club or organization: Not on file    Attends meetings of clubs or organizations: Not on file    Relationship status: Not on file  . Intimate partner violence:    Fear of current or ex partner: Not on file    Emotionally abused: Not on file    Physically abused: Not on file    Forced sexual activity: Not on file    Other Topics Concern  . Not on file  Social History Narrative  . Not on file    REVIEW OF SYSTEMS: Constitutional: No fevers, chills, or sweats, no generalized fatigue, change in appetite Eyes: No visual changes, double vision, eye pain Ear, nose and throat: No hearing loss, ear pain, nasal congestion, sore throat Cardiovascular: No chest pain, palpitations Respiratory:  No shortness of breath at rest or with exertion, wheezes GastrointestinaI: No nausea, vomiting, diarrhea, abdominal pain, fecal incontinence Genitourinary:  No dysuria, urinary retention or frequency Musculoskeletal:  + neck pain, back pain Integumentary: No rash, pruritus, skin lesions Neurological: as above Psychiatric: No depression,+ insomnia, anxiety Endocrine: No palpitations, fatigue, diaphoresis, mood swings, change in appetite, change in weight, increased thirst Hematologic/Lymphatic:  No anemia, purpura, petechiae. Allergic/Immunologic: no itchy/runny eyes, nasal congestion, recent allergic reactions, rashes  PHYSICAL EXAM: Vitals:   02/01/19 0921  BP: 128/86  Pulse: 67  SpO2: 95%   General: No acute distress Head:  Normocephalic/atraumatic Eyes: Fundoscopic exam shows bilateral sharp discs, no vessel changes, exudates, or hemorrhages Neck: supple, no paraspinal tenderness, full range of motion Back: No paraspinal tenderness Heart: regular rate and rhythm Lungs: Clear to auscultation bilaterally. Vascular: No carotid bruits. Skin/Extremities: No rash, no edema Neurological Exam: Mental status: alert and oriented to person, place, and time, no dysarthria or aphasia, Fund of knowledge is appropriate.  Remote memory intact.  Attention and concentration are normal.    Able to name objects and repeat phrases. Decreased fluency. Montreal Cognitive Assessment  02/01/2019  Visuospatial/ Executive (0/5) 3  Naming (0/3) 2  Attention: Read list of digits (0/2) 2  Attention: Read list of letters (0/1) 1   Attention: Serial 7 subtraction starting at 100 (0/3) 2  Language: Repeat phrase (0/2) 2  Language : Fluency (0/1) 0  Abstraction (0/2) 2  Delayed Recall (0/5) 2  Orientation (0/6) 6  Total 22  Adjusted Score (based on education) 23   Cranial nerves: CN I: not tested CN II: pupils equal, round and reactive to light, visual fields intact, fundi unremarkable. CN III, IV, VI:  full range of motion, no  nystagmus, no ptosis CN V: facial sensation intact CN VII: upper and lower face symmetric CN VIII: hearing intact to finger rub CN IX, X: gag intact, uvula midline CN XI: sternocleidomastoid and trapezius muscles intact CN XII: tongue midline Bulk & Tone: normal, no fasciculations. Motor: 5/5 throughout with no pronator drift. Sensation: intact to light touch, cold, pin, vibration and joint position sense.  No extinction to double simultaneous stimulation.  Romberg test negative Deep Tendon Reflexes: +2 throughout, no ankle clonus Plantar responses: downgoing bilaterally Cerebellar: no incoordination on finger to nose, heel to shin. No dysdiadochokinesia Gait: narrow-based and steady, able to tandem walk adequately. Tremor: none  IMPRESSION: This is a pleasant 78 year old right-handed woman with a history of  hypertension, hyperlipidemia, hypothyroidism, aortic stenosis s/p aortic valve replacement in March 2019, presenting for evaluation of worsening memory. Her neurological exam is non-focal, MOCA score today 23/30. 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 By history, she is able to perform complex tasks independently and does not fit criteria for dementia. We discussed that Mild cognitive impairment means there are serious cognitive problems by report and testing but the patient is functioning normally. Around 50% of MCI patients progress to dementia (functional impairment) over 5 years. Dementia implies that ADLs are currently compromised. We discussed different causes  of memory changes, check B12 level. MRI brain without contrast will be ordered to assess for underlying structural abnormality and assess vascular load. We discussed the importance of control of vascular risk factors, physical exercise, and brain stimulation exercises for brain health. Follow-up in 6 months, they know to call for any changes.   Thank you for allowing me to participate in the care of this patient. Please do not hesitate to call for any questions or concerns.   Ellouise Newer, M.D.  CC: Dr. Marisue Humble

## 2019-02-02 ENCOUNTER — Telehealth: Payer: Self-pay

## 2019-02-02 NOTE — Telephone Encounter (Signed)
Spoke with pt relaying results below 

## 2019-02-02 NOTE — Telephone Encounter (Signed)
-----   Message from Cameron Sprang, MD sent at 02/02/2019  8:44 AM EST ----- Pls let her know B12 level is normal, thanks

## 2019-03-22 ENCOUNTER — Encounter: Payer: Medicare HMO | Admitting: Thoracic Surgery (Cardiothoracic Vascular Surgery)

## 2019-04-23 DIAGNOSIS — E7800 Pure hypercholesterolemia, unspecified: Secondary | ICD-10-CM | POA: Diagnosis not present

## 2019-04-23 DIAGNOSIS — I10 Essential (primary) hypertension: Secondary | ICD-10-CM | POA: Diagnosis not present

## 2019-04-23 DIAGNOSIS — Z1389 Encounter for screening for other disorder: Secondary | ICD-10-CM | POA: Diagnosis not present

## 2019-04-23 DIAGNOSIS — E890 Postprocedural hypothyroidism: Secondary | ICD-10-CM | POA: Diagnosis not present

## 2019-04-23 DIAGNOSIS — M8580 Other specified disorders of bone density and structure, unspecified site: Secondary | ICD-10-CM | POA: Diagnosis not present

## 2019-04-23 DIAGNOSIS — Z0000 Encounter for general adult medical examination without abnormal findings: Secondary | ICD-10-CM | POA: Diagnosis not present

## 2019-04-23 DIAGNOSIS — E559 Vitamin D deficiency, unspecified: Secondary | ICD-10-CM | POA: Diagnosis not present

## 2019-04-26 ENCOUNTER — Telehealth: Payer: Medicare HMO | Admitting: Thoracic Surgery (Cardiothoracic Vascular Surgery)

## 2019-07-07 ENCOUNTER — Ambulatory Visit: Payer: Medicare HMO | Admitting: Neurology

## 2019-07-17 ENCOUNTER — Other Ambulatory Visit: Payer: Self-pay

## 2019-07-17 ENCOUNTER — Ambulatory Visit
Admission: RE | Admit: 2019-07-17 | Discharge: 2019-07-17 | Disposition: A | Discharge status: Home / Self Care | Payer: Medicare HMO | Source: Ambulatory Visit | Attending: Neurology | Admitting: Neurology

## 2019-07-17 DIAGNOSIS — R413 Other amnesia: Secondary | ICD-10-CM | POA: Diagnosis not present

## 2019-07-17 DIAGNOSIS — R202 Paresthesia of skin: Secondary | ICD-10-CM | POA: Diagnosis not present

## 2019-07-19 ENCOUNTER — Telehealth: Payer: Self-pay

## 2019-07-19 NOTE — Telephone Encounter (Signed)
Pt informed of normal results. Pt has no concerns at this time.

## 2019-07-19 NOTE — Telephone Encounter (Signed)
-----   Message from Cameron Sprang, MD sent at 07/19/2019  9:51 AM EDT ----- Pls let patient/daughter know the MRI brain did not show any evidence of tumor, stroke, or bleed. It showed age-related changes. Thanks

## 2019-08-23 ENCOUNTER — Other Ambulatory Visit: Payer: Self-pay

## 2019-08-23 ENCOUNTER — Ambulatory Visit (INDEPENDENT_AMBULATORY_CARE_PROVIDER_SITE_OTHER): Payer: Medicare HMO | Admitting: Neurology

## 2019-08-23 ENCOUNTER — Encounter: Payer: Self-pay | Admitting: Neurology

## 2019-08-23 VITALS — BP 169/69 | HR 74 | Ht 63.5 in | Wt 154.0 lb

## 2019-08-23 DIAGNOSIS — G3184 Mild cognitive impairment, so stated: Secondary | ICD-10-CM | POA: Diagnosis not present

## 2019-08-23 NOTE — Patient Instructions (Signed)
Great seeing you! Follow-up in 6-7 months. If you would like to proceed with Neurocognitive testing, let us know.    RECOMMENDATIONS FOR ALL PATIENTS WITH MEMORY PROBLEMS: 1. Continue to exercise (Recommend 30 minutes of walking everyday, or 3 hours every week) 2. Increase social interactions - continue going to [place] and enjoy social gatherings with friends and family 3. Eat healthy, avoid fried foods and eat more fruits and vegetables 4. Maintain adequate blood pressure, blood sugar, and blood cholesterol level. Reducing the risk of stroke and cardiovascular disease also helps promoting better memory. 5. Avoid stressful situations. Live a simple life and avoid aggravations. Organize your time and prepare for the next day in anticipation. 6. Sleep well, avoid any interruptions of sleep and avoid any distractions in the bedroom that may interfere with adequate sleep quality 7. Avoid sugar, avoid sweets as there is a strong link between excessive sugar intake, diabetes, and cognitive impairment The Mediterranean diet has been shown to help patients reduce the risk of progressive memory disorders and reduces cardiovascular risk. This includes eating fish, eat fruits and green leafy vegetables, nuts like almonds and hazelnuts, walnuts, and also use olive oil. Avoid fast foods and fried foods as much as possible. Avoid sweets and sugar as sugar use has been linked to worsening of memory function.

## 2019-08-23 NOTE — Progress Notes (Signed)
NEUROLOGY FOLLOW UP OFFICE NOTE  Cheyenne Jones [id number]:[id number] 06/13/1941  HISTORY OF PRESENT ILLNESS: I had the pleasure of seeing Cheyenne Jones in follow-up in the neurology clinic on 08/23/2019.  The patient was last seen 6 months ago for Mild Cognitive Impairment. She is again accompanied by her daughter who helps supplement the history today.  Records and images were personally reviewed where available.  MOCA score 23/30 in February 2020. I personally reviewed MRI brain without contrast done 06/2019 which did not show any acute changes, there was mild diffuse atrophy and minimal chronic microvascular disease. B12 level 495. Since her last visit, she feels her memory is okay. Her daughter denies any difficulties with daily activities, but she did have an incident where she went to the bank and had the wrong account number, she ripped up the form then wrote down the same wrong account number again. She mostly forgets her morning medication when she has an errand in the morning. 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 She denies getting lost driving. She denies any headaches, dizziness, vision changes, no falls. No personality changes, no paranoia or hallucinations. Sleep has always been poor, no daytime drowsiness.   History on Initial Assessment 02/01/2019: This is a 78 year old woman with a history of hypertension, hyperlipidemia, hypothyroidism, aortic stenosis s/p aortic valve replacement in March 2019, presenting for evaluation of worsening memory. She states "when you get older, you forget things." She would be talking about something and forget a name, or book/movie title. It comes back to her later. She lives with her husband and son and manages bills without difficulties. She is usually good with taking her medications. She denies getting lost driving. Her daughter who lives out of state started noticing changes after her heart surgery in March 2019, she  would repeat herself. She slipped on the ground in September 2019 and hit the back of her head, no loss of consciousness but had headaches after. She went to the ER 2 days later and had a head CT without contrast which I personally reviewed, no acute changes, there was mild diffuse atrophy. She had an incident around that time where she put leftover food in a container in the cabinet and forgot about it. It started to smell bad and she could not find it, until she went to use the container later on. This incident concerned her daughter. In the past she left the dish rag in the fridge. She denies leaving the stove or on faucet running. She is independent with dressing and bathing, no hygiene concerns. She has told her daughter "sometimes I think I'm losing my mind." Her daughter denies any paranoia or hallucinations. Cheyenne Jones has noticed that she gets anxious when someone else is driving and would scream out that they would hit a car. No family history of dementia. She rarely drinks alcohol. No other history of concussions.   She had headaches after her fall in September, this has improved. She has dizziness when bending down. No diplopia, dysarthria/dysphagia, focal weakness, bowel/bladder dysfunction, anosmia, or tremors. She has neck pain when tired or stressed out. She has occasional tingling in both feet. She has had back pain with pain radiating down her left leg since the fall, reporting this is better. She does home PT exercises when pain starts to worsen. She reports poor sleep for many years. She has been told she snores and has woken herself up with the snoring. She would get  drowsy during the day when not stimulated.   Laboratory Data: TSH at PCP office normal.  PAST MEDICAL HISTORY: Past Medical History:  Diagnosis Date  . Adenomatous colon polyp   . Allergy   . Arthritis   . Bronchitis, asthmatic   . Carotid artery disease ([place]) 04/03/2018   Carotid US 03/18/18- Bilateral ICA 40-59  .  Diverticulosis   . DJD (degenerative joint disease)   . Hypercholesterolemia   . Hypertension   . Hypothyroidism   . Osteopenia   . S/P aortic valve replacement with bioprosthetic valve 03/20/2018   2/2 Aortic Stenosis // 23 mm Edwards Intuity Elite rapid deployment stented bovine pericardial tissue valve // Echo 6/19: Mild concentric LVH, EF 60-65, normal wall motion, grade 1 diastolic dysfunction, normally functioning bioprosthetic AVR (mean 10, peak 19), mild to moderate MR, mild LAE, moderate TR, PASP 35   . Vitamin D deficiency     MEDICATIONS: Current Outpatient Medications on File Prior to Visit  Medication Sig Dispense Refill  . amoxicillin (AMOXIL) 500 MG capsule Take 2,000 mg by mouth as directed. Prior to dental procedures     . aspirin EC 81 MG tablet Take 81 mg by mouth daily.    . Calcium Carbonate-Vitamin D (CALCIUM 500 + D PO) Take 2 tablets by mouth daily.    . Cholecalciferol (VITAMIN D) 2000 units CAPS Take 2,000 Units by mouth daily.    Marland Kitchen FIBER SELECT GUMMIES CHEW Chew 1 tablet by mouth daily.     Marland Kitchen ibuprofen (ADVIL,MOTRIN) 200 MG tablet Take 200-400 mg by mouth daily as needed for headache or moderate pain.     Marland Kitchen levothyroxine (SYNTHROID, LEVOTHROID) 100 MCG tablet Take 100 mcg by mouth daily before breakfast.    . lisinopril (PRINIVIL,ZESTRIL) 2.5 MG tablet Take 1 tablet (2.5 mg total) by mouth daily. 90 tablet 3  . metoprolol tartrate (LOPRESSOR) 25 MG tablet Take 0.5 tablets (12.5 mg total) by mouth 2 (two) times daily. 90 tablet 3  . Multiple Vitamins-Minerals (MULTIVITAMIN PO) Take 1 tablet by mouth daily.     No current facility-administered medications on file prior to visit.     ALLERGIES: Allergies  Allergen Reactions  . Fenofibrate Other (See Comments)    LEG ACHES, 09/2012, SIDE EFFECTS  . Keflex [Cephalexin] Other (See Comments)    LEG ACHES, 09/2012, SIDE EFFECTS  . Livalo [Pitavastatin] Other (See Comments)    LEG ACHES  . Lovastatin Other (See  Comments)    LEG ACHES, SIDE EFFECTS  . Pravastatin Other (See Comments)    LEG ACHES, SIDE EFFECTS    FAMILY HISTORY: Family History  Problem Relation Age of Onset  . Ovarian cancer Sister   . Colon polyps Brother     SOCIAL HISTORY: Social History   Socioeconomic History  . Marital status: Married    Spouse name: Not on file  . Number of children: Not on file  . Years of education: Not on file  . Highest education level: Not on file  Occupational History  . Occupation: RETIRED  Social Needs  . Financial resource strain: Not on file  . Food insecurity    Worry: Not on file    Inability: Not on file  . Transportation needs    Medical: Not on file    Non-medical: Not on file  Tobacco Use  . Smoking status: Never Smoker  . Smokeless tobacco: Never Used  Substance and Sexual Activity  . Alcohol use: Yes    Alcohol/week: 2.0  standard drinks    Types: 2 Glasses of wine per week    Comment: occasional  . Drug use: No  . Sexual activity: Not on file  Lifestyle  . Physical activity    Days per week: Not on file    Minutes per session: Not on file  . Stress: Not on file  Relationships  . Social Herbalist on phone: Not on file    Gets together: Not on file    Attends religious service: Not on file    Active member of club or organization: Not on file    Attends meetings of clubs or organizations: Not on file    Relationship status: Not on file  . Intimate partner violence    Fear of current or ex partner: Not on file    Emotionally abused: Not on file    Physically abused: Not on file    Forced sexual activity: Not on file  Other Topics Concern  . Not on file  Social History Narrative   Pt is right handed   Lives in 3 story home with her husband and son   Has 2 adult children   High school graduate    REVIEW OF SYSTEMS: Constitutional: No fevers, chills, or sweats, no generalized fatigue, change in appetite Eyes: No visual changes, double  vision, eye pain Ear, nose and throat: No hearing loss, ear pain, nasal congestion, sore throat Cardiovascular: No chest pain, palpitations Respiratory:  No shortness of breath at rest or with exertion, wheezes GastrointestinaI: No nausea, vomiting, diarrhea, abdominal pain, fecal incontinence Genitourinary:  No dysuria, urinary retention or frequency Musculoskeletal:  No neck pain, back pain Integumentary: No rash, pruritus, skin lesions Neurological: as above Psychiatric: No depression, insomnia, anxiety Endocrine: No palpitations, fatigue, diaphoresis, mood swings, change in appetite, change in weight, increased thirst Hematologic/Lymphatic:  No anemia, purpura, petechiae. Allergic/Immunologic: no itchy/runny eyes, nasal congestion, recent allergic reactions, rashes  PHYSICAL EXAM: Vitals:   08/23/19 1604  BP: (!) 169/69  Pulse: 74  SpO2: 96%   General: No acute distress Head:  Normocephalic/atraumatic Skin/Extremities: No rash, no edema Neurological Exam: alert and oriented to person, place, and time. No aphasia or dysarthria. Fund of knowledge is appropriate.  Recent and remote memory are intact.  Attention and concentration are normal.    Able to name objects and repeat phrases.  Montreal Cognitive Assessment  08/23/2019 02/01/2019  Visuospatial/ Executive (0/5) 3 3  Naming (0/3) 2 2  Attention: Read list of digits (0/2) 2 2  Attention: Read list of letters (0/1) 1 1  Attention: Serial 7 subtraction starting at 100 (0/3) 3 2  Language: Repeat phrase (0/2) 2 2  Language : Fluency (0/1) 1 0  Abstraction (0/2) 1 2  Delayed Recall (0/5) 3 2  Orientation (0/6) 6 6  Total 24 22  Adjusted Score (based on education) 25 23   Cranial nerves: Pupils equal, round. No facial asymmetry. Motor: moves all extremities symmetrically. Gait narrow-based and steady.   IMPRESSION: This is a pleasant 78 year old right-handed woman with a history of  hypertension, hyperlipidemia, hypothyroidism,  aortic stenosis s/p aortic valve replacement in March 2019, with Mild Cognitive Impairment. MOCA score today 25/30 (23/30 in February 2020). MRI brain no acute changes. They deny any significant difficulties with complex tasks. We discussed doing Neurocognitive testing to further evaluate memory concerns, she would like to hold off for now. No clear indication for starting cholinesterase inhibitors. Continue to monitor. We again  discussed the importance of control of vascular risk factors, physical exercise, and brain stimulation exercises for brain health. Follow-up in 6 months, they know to call for any changes.  Thank you for allowing me to participate in her care.  Please do not hesitate to call for any questions or concerns.    Ellouise Newer, M.D.   CC: Dr. Marisue Humble

## 2019-10-24 IMAGING — CR DG CHEST 2V
2 series · 2 of 2 positions shown · non-contrast
Comparison: Radiographs dated 03/23/2018 and 03/18/2018

CLINICAL DATA: Status post aortic valve replacement on 03/20/2018.
Left pleural effusion.

EXAM:
CHEST - 2 VIEW

[w chest pa]
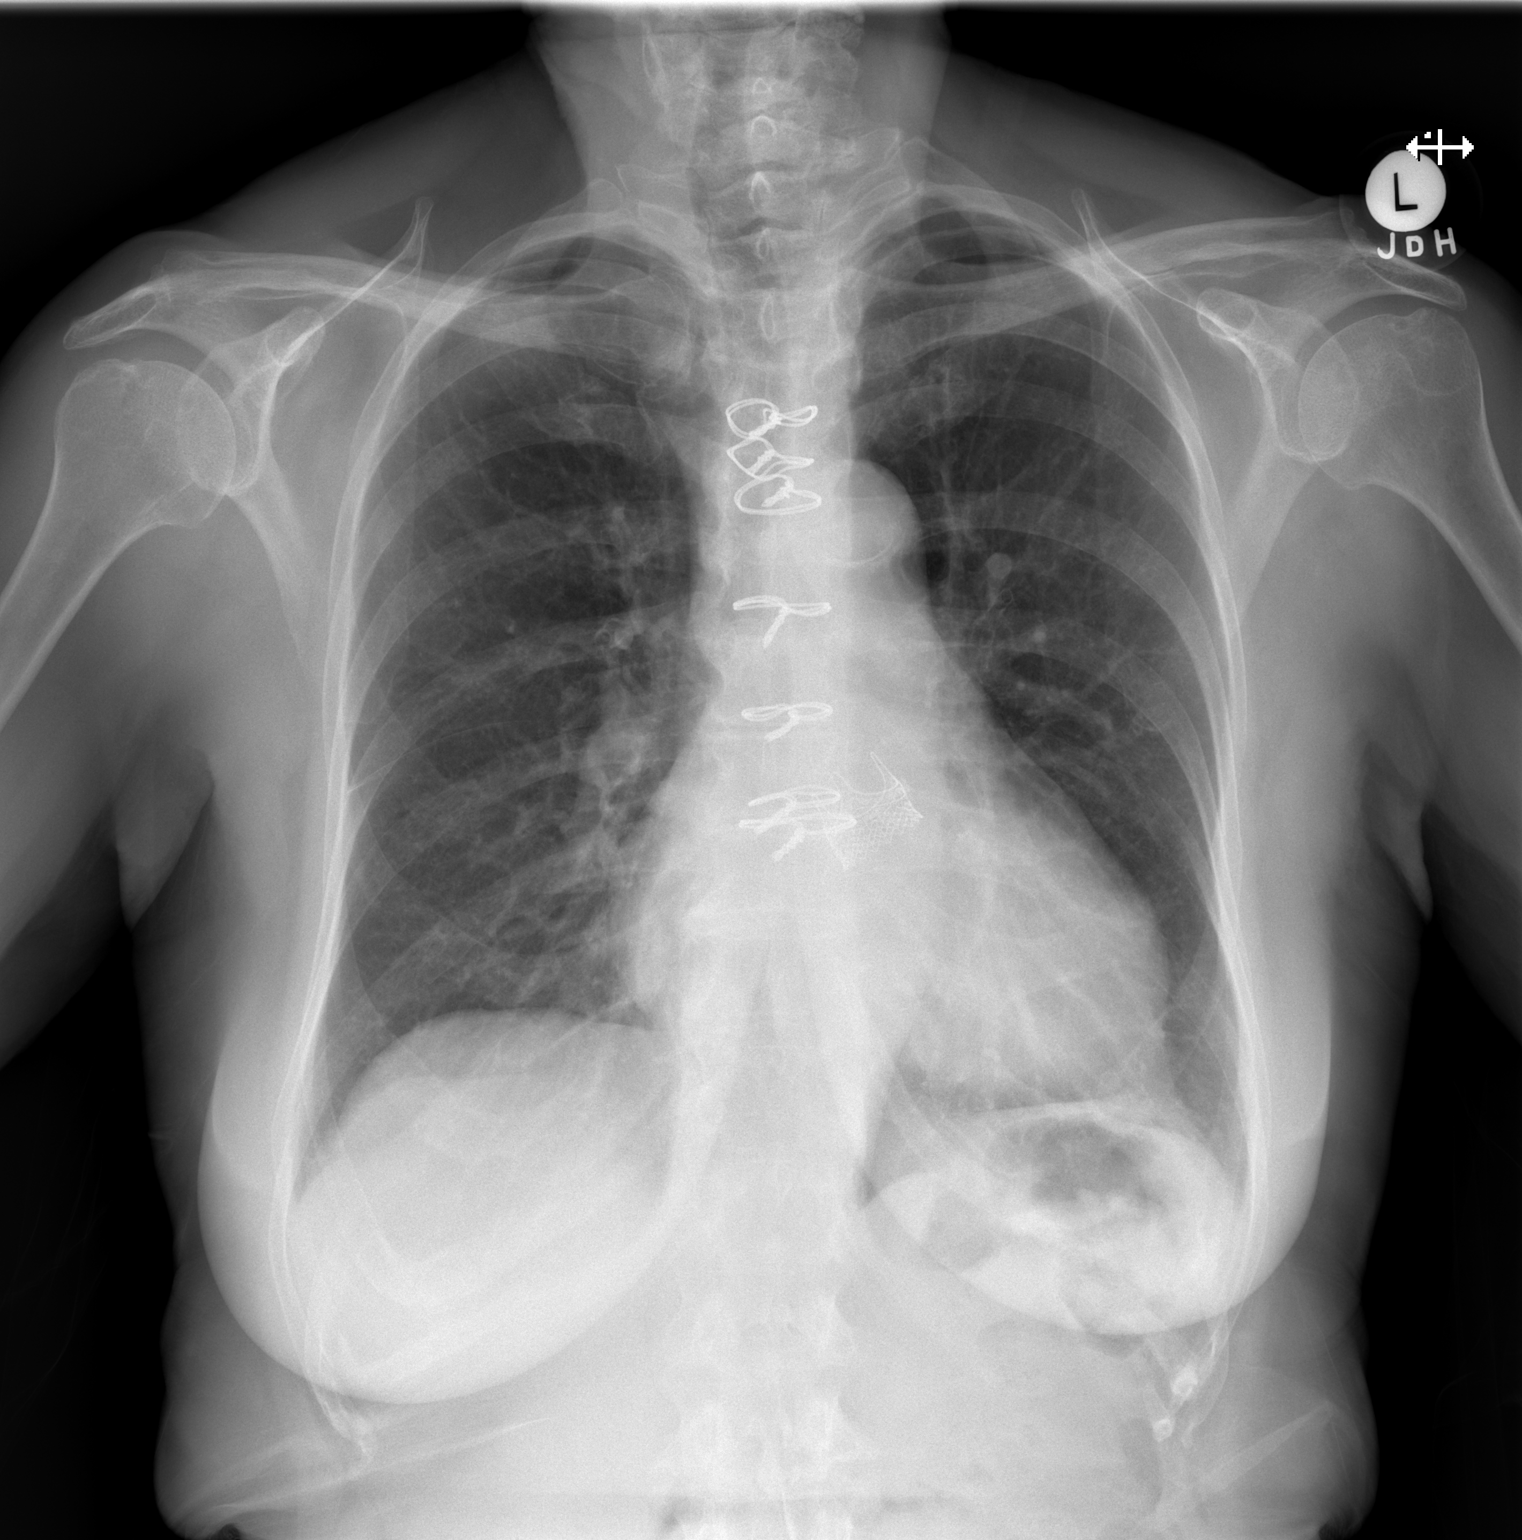

[w chest lat]
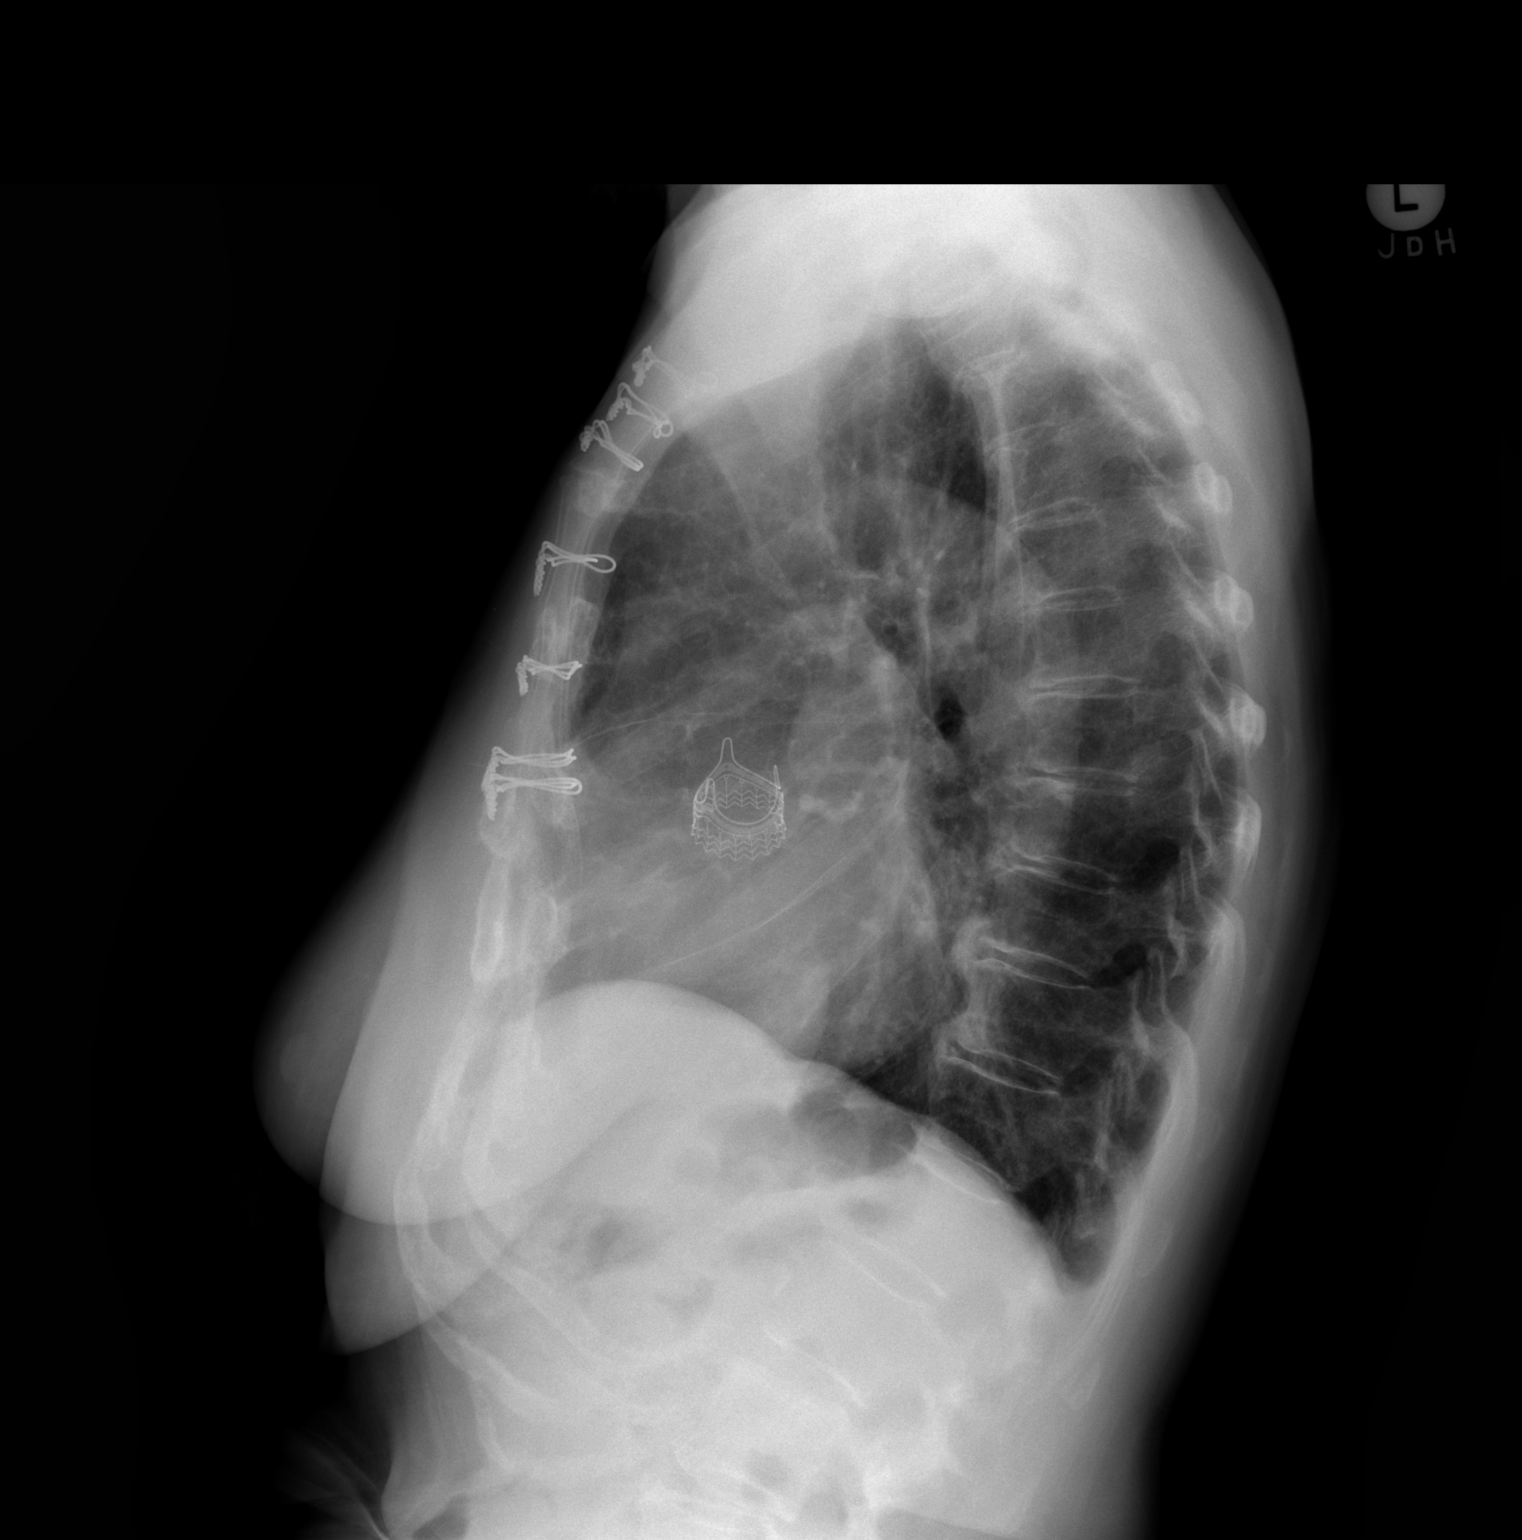

[2 of 2 positions shown; findings below may reference images not displayed]

FINDINGS: Almost complete of the left pleural effusion since the prior chest
x-ray. Minimal residual blunting of the costophrenic angles
posteriorly. Lungs are clear. No pneumothorax.

Chronic cardiomegaly. Prosthetic aortic valve. Aortic
atherosclerosis.
IMPRESSION: Tiny residual bilateral pleural effusions.  Chronic cardiomegaly.

## 2020-01-17 DIAGNOSIS — Z1231 Encounter for screening mammogram for malignant neoplasm of breast: Secondary | ICD-10-CM | POA: Diagnosis not present

## 2020-02-28 DIAGNOSIS — H2513 Age-related nuclear cataract, bilateral: Secondary | ICD-10-CM | POA: Diagnosis not present

## 2020-02-28 DIAGNOSIS — H524 Presbyopia: Secondary | ICD-10-CM | POA: Diagnosis not present

## 2020-02-28 DIAGNOSIS — H11153 Pinguecula, bilateral: Secondary | ICD-10-CM | POA: Diagnosis not present

## 2020-02-28 DIAGNOSIS — H25013 Cortical age-related cataract, bilateral: Secondary | ICD-10-CM | POA: Diagnosis not present

## 2020-02-28 DIAGNOSIS — H16143 Punctate keratitis, bilateral: Secondary | ICD-10-CM | POA: Diagnosis not present

## 2020-02-28 DIAGNOSIS — H18413 Arcus senilis, bilateral: Secondary | ICD-10-CM | POA: Diagnosis not present

## 2020-02-28 DIAGNOSIS — H16223 Keratoconjunctivitis sicca, not specified as Sjogren's, bilateral: Secondary | ICD-10-CM | POA: Diagnosis not present

## 2020-02-28 DIAGNOSIS — H3589 Other specified retinal disorders: Secondary | ICD-10-CM | POA: Diagnosis not present

## 2020-03-20 ENCOUNTER — Ambulatory Visit: Payer: Medicare HMO | Admitting: Neurology

## 2020-03-21 DIAGNOSIS — H16223 Keratoconjunctivitis sicca, not specified as Sjogren's, bilateral: Secondary | ICD-10-CM | POA: Diagnosis not present

## 2020-03-21 DIAGNOSIS — H2513 Age-related nuclear cataract, bilateral: Secondary | ICD-10-CM | POA: Diagnosis not present

## 2020-04-25 DIAGNOSIS — E559 Vitamin D deficiency, unspecified: Secondary | ICD-10-CM | POA: Diagnosis not present

## 2020-04-25 DIAGNOSIS — Z0000 Encounter for general adult medical examination without abnormal findings: Secondary | ICD-10-CM | POA: Diagnosis not present

## 2020-04-25 DIAGNOSIS — E890 Postprocedural hypothyroidism: Secondary | ICD-10-CM | POA: Diagnosis not present

## 2020-04-25 DIAGNOSIS — E7800 Pure hypercholesterolemia, unspecified: Secondary | ICD-10-CM | POA: Diagnosis not present

## 2020-04-25 DIAGNOSIS — Z1389 Encounter for screening for other disorder: Secondary | ICD-10-CM | POA: Diagnosis not present

## 2020-04-25 DIAGNOSIS — G3184 Mild cognitive impairment, so stated: Secondary | ICD-10-CM | POA: Diagnosis not present

## 2020-04-25 DIAGNOSIS — I10 Essential (primary) hypertension: Secondary | ICD-10-CM | POA: Diagnosis not present

## 2020-04-25 DIAGNOSIS — M8580 Other specified disorders of bone density and structure, unspecified site: Secondary | ICD-10-CM | POA: Diagnosis not present

## 2020-04-25 DIAGNOSIS — T466X5A Adverse effect of antihyperlipidemic and antiarteriosclerotic drugs, initial encounter: Secondary | ICD-10-CM | POA: Diagnosis not present

## 2020-05-11 DIAGNOSIS — H2512 Age-related nuclear cataract, left eye: Secondary | ICD-10-CM | POA: Diagnosis not present

## 2020-05-22 DIAGNOSIS — H2511 Age-related nuclear cataract, right eye: Secondary | ICD-10-CM | POA: Diagnosis not present

## 2020-05-25 DIAGNOSIS — H2511 Age-related nuclear cataract, right eye: Secondary | ICD-10-CM | POA: Diagnosis not present

## 2020-05-25 DIAGNOSIS — H25011 Cortical age-related cataract, right eye: Secondary | ICD-10-CM | POA: Diagnosis not present

## 2021-01-19 IMAGING — MR MRI HEAD WITHOUT CONTRAST
11 series · 45 of 48 positions shown · non-contrast
Comparison: Head CT 09/17/2018

CLINICAL DATA: Memory loss. Leg pain with numbness and tingling in
the feet and back. Difficulty walking.

EXAM:
MRI HEAD WITHOUT CONTRAST
TECHNIQUE: Multiplanar, multiecho pulse sequences of the brain and surrounding
structures were obtained without intravenous contrast.

[Series 2: T1 · sagittal · 5.0mm · 0.45mm/px · 2 of 24 slices shown]
[im 1/24]
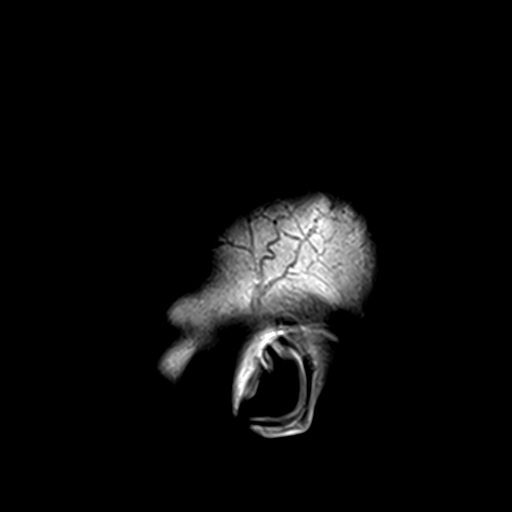
[im 24/24]
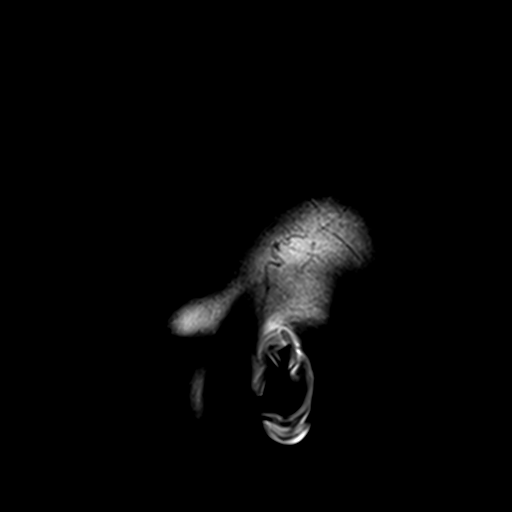

[Series 3: DWI · axial · 3.0mm · 1.80mm/px · z∈[-43,+106]mm · 8 of 102 slices shown (1 of 4)]
[im 1/102]
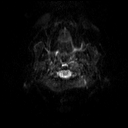
[im 15/102]
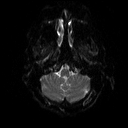
[im 29/102]
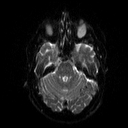
[im 44/102]
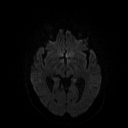
[im 58/102]
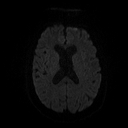
[im 73/102]
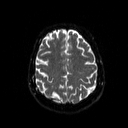
[im 87/102]
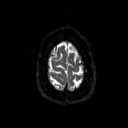
[im 102/102]
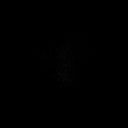

[Series 4: DWI · axial · 3.0mm · 1.80mm/px · z∈[-43,+106]mm · 4 of 50 slices shown (2 of 4)]
[im 1/50]
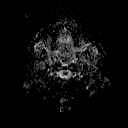
[im 17/50]
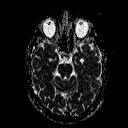
[im 33/50]
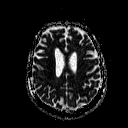
[im 50/50]
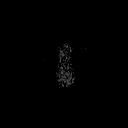

[Series 6: swi_images · axial · 2.0mm · 0.90mm/px · z∈[-46,+111]mm · 6 of 80 slices shown]
[im 1/80]
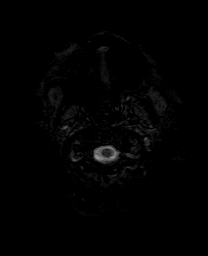
[im 16/80]
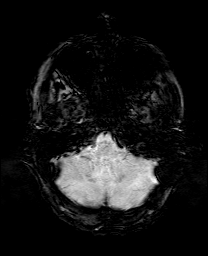
[im 32/80]
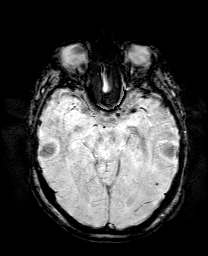
[im 48/80]
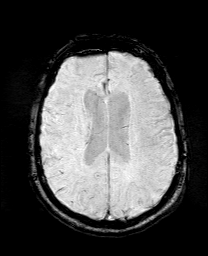
[im 64/80]
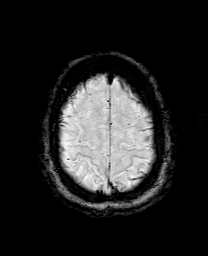
[im 80/80]
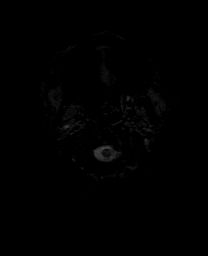

[Series 7: DWI · coronal · 5.0mm · 1.80mm/px · 5 of 71 slices shown (3 of 4)]
[im 1/71]
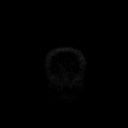
[im 18/71]
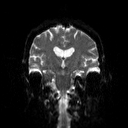
[im 36/71]
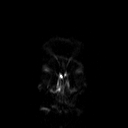
[im 53/71]
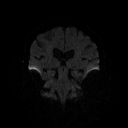
[im 71/71]
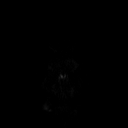

[Series 8: DWI · coronal · 5.0mm · 1.80mm/px · 3 of 37 slices shown (4 of 4)]
[im 1/37]
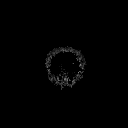
[im 19/37]
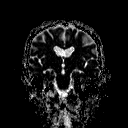
[im 37/37]
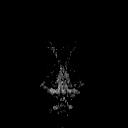

[Series 9: T2 · axial · 5.0mm · 0.51mm/px · z∈[-45,+110]mm · 2 of 24 slices shown (1 of 2)]
[im 1/24]
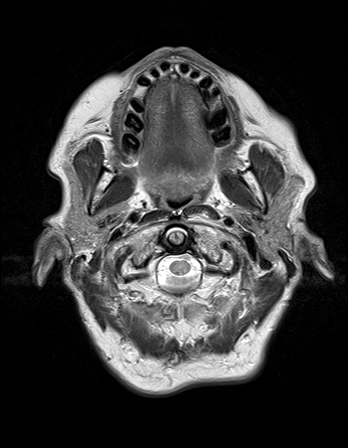
[im 24/24]
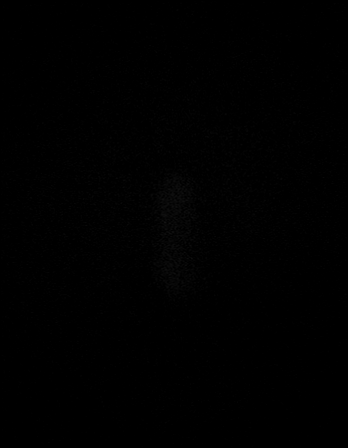

[Series 10: FLAIR · axial · 3.0mm · 0.45mm/px · z∈[-45,+110]mm · 2 of 27 slices shown (1 of 2)]
[im 1/27]
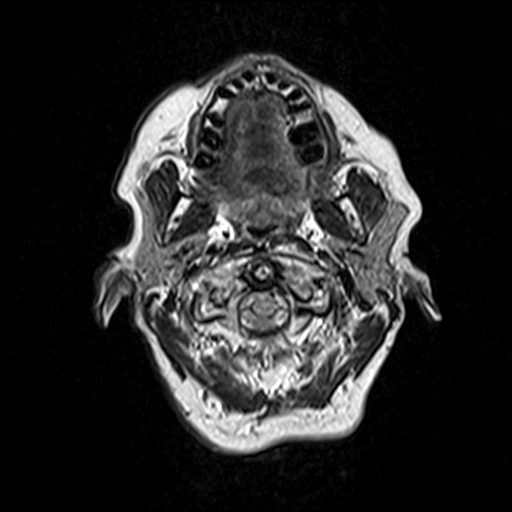
[im 27/27]
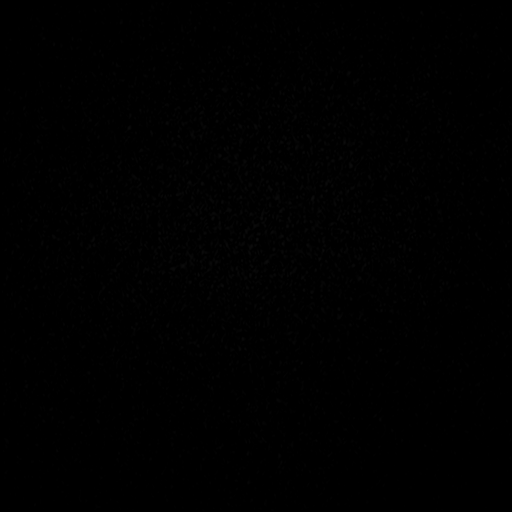

[Series 11: t1_mpr_tra copy center · axial · 1.0mm · 0.45mm/px · z∈[-46,+112]mm · 9 of 160 slices shown]
[im 1/160]
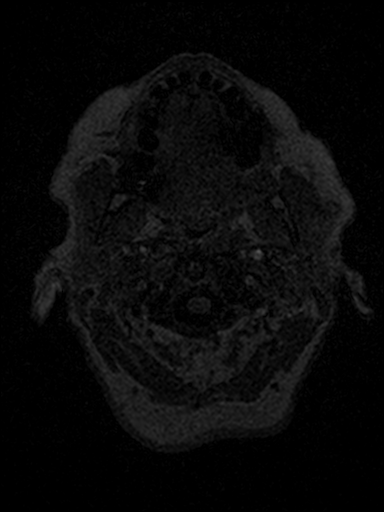
[im 15/160]
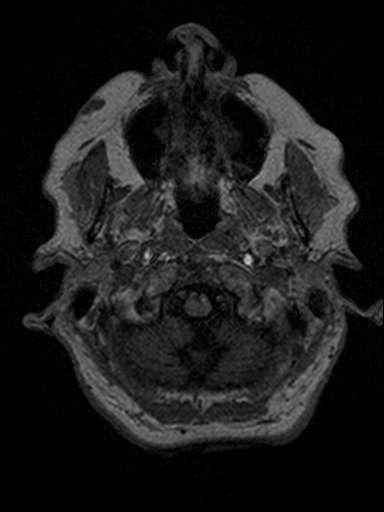
[im 29/160]
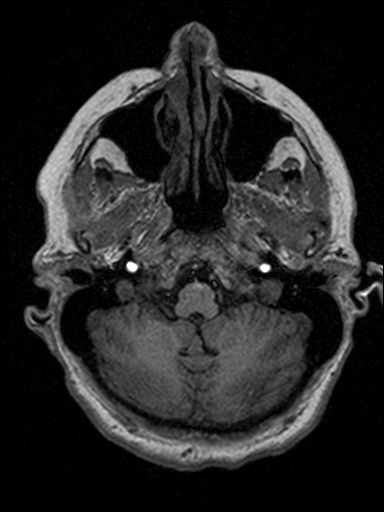
[im 44/160]
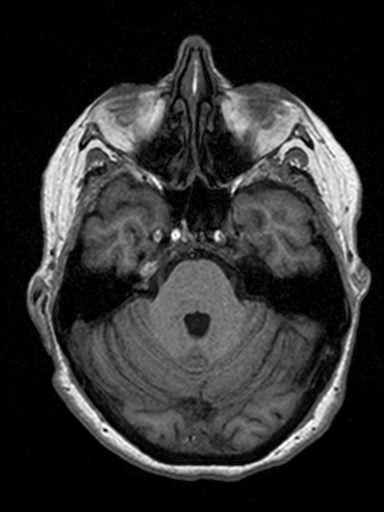
[im 73/160]
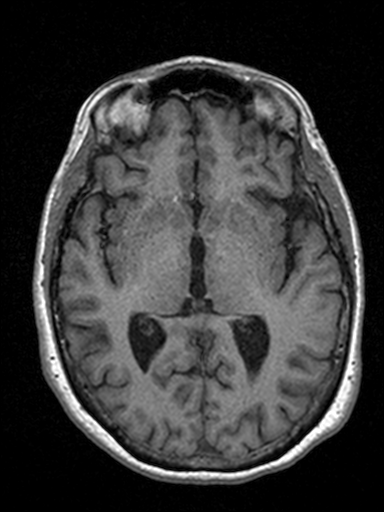
[im 87/160]
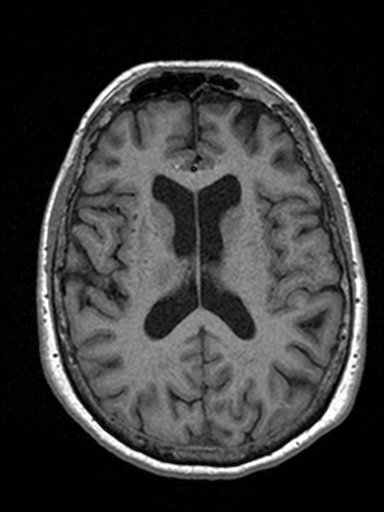
[im 116/160]
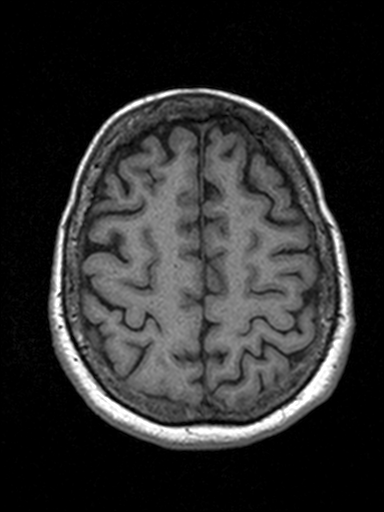
[im 131/160]
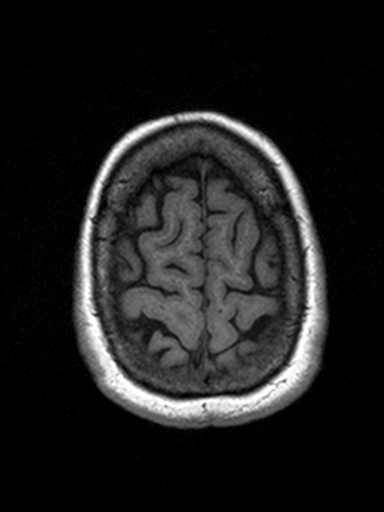
[im 160/160]
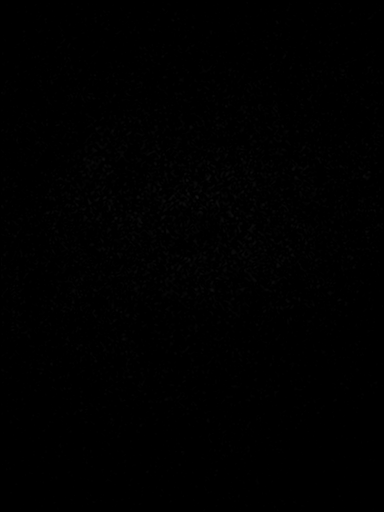

[Series 12: T2 · coronal · 5.0mm · 0.45mm/px · 2 of 28 slices shown (2 of 2)]
[im 1/28]
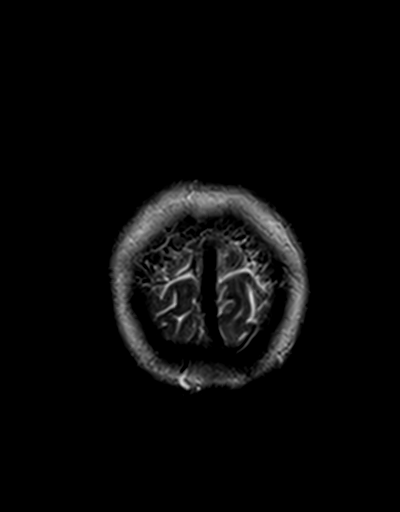
[im 28/28]
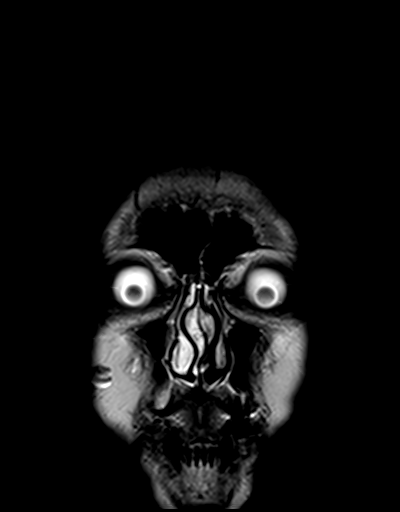

[Series 13: FLAIR · sagittal · 5.0mm · 0.45mm/px · 2 of 27 slices shown (2 of 2)]
[im 1/27]
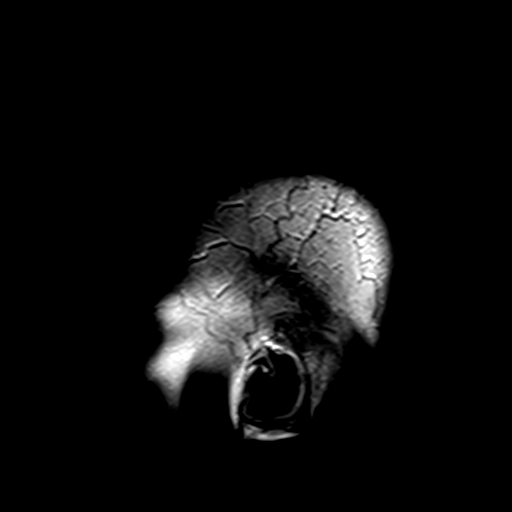
[im 27/27]
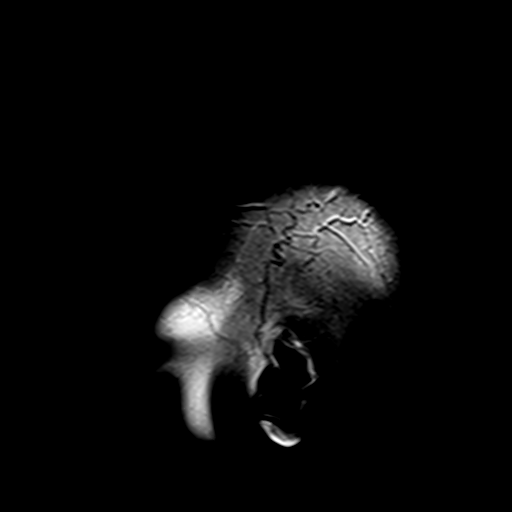

[45 of 48 positions shown; findings below may reference images not displayed]

FINDINGS: Brain: There is no evidence of acute infarct, mass, midline shift,
or extra-axial fluid collection. Single chronic microhemorrhages are
noted in the temporal occipital regions bilaterally. Scattered small
foci of T2 hyperintensity in the cerebral white matter bilaterally
are nonspecific but compatible with minimal chronic small vessel
ischemic disease, not necessarily abnormal for age. Mild cerebral
atrophy is not greater than expected for age.

Vascular: Major intracranial vascular flow voids are preserved.

Skull and upper cervical spine: Unremarkable bone marrow signal.

Sinuses/Orbits: Unremarkable orbits. Minimal left maxillary sinus
mucosal thickening. Clear mastoid air cells.

Other: None.
IMPRESSION: 1. No acute intracranial abnormality.
2. Minimal chronic small vessel ischemic disease.

## 2021-02-20 DIAGNOSIS — Z1231 Encounter for screening mammogram for malignant neoplasm of breast: Secondary | ICD-10-CM | POA: Diagnosis not present

## 2021-04-27 DIAGNOSIS — E559 Vitamin D deficiency, unspecified: Secondary | ICD-10-CM | POA: Diagnosis not present

## 2021-04-27 DIAGNOSIS — Z1389 Encounter for screening for other disorder: Secondary | ICD-10-CM | POA: Diagnosis not present

## 2021-04-27 DIAGNOSIS — Z0000 Encounter for general adult medical examination without abnormal findings: Secondary | ICD-10-CM | POA: Diagnosis not present

## 2021-04-27 DIAGNOSIS — I10 Essential (primary) hypertension: Secondary | ICD-10-CM | POA: Diagnosis not present

## 2021-04-27 DIAGNOSIS — M542 Cervicalgia: Secondary | ICD-10-CM | POA: Diagnosis not present

## 2021-04-27 DIAGNOSIS — E890 Postprocedural hypothyroidism: Secondary | ICD-10-CM | POA: Diagnosis not present

## 2021-04-27 DIAGNOSIS — E7800 Pure hypercholesterolemia, unspecified: Secondary | ICD-10-CM | POA: Diagnosis not present

## 2021-04-27 DIAGNOSIS — G3184 Mild cognitive impairment, so stated: Secondary | ICD-10-CM | POA: Diagnosis not present

## 2021-04-27 DIAGNOSIS — T466X5A Adverse effect of antihyperlipidemic and antiarteriosclerotic drugs, initial encounter: Secondary | ICD-10-CM | POA: Diagnosis not present

## 2021-05-18 DIAGNOSIS — M542 Cervicalgia: Secondary | ICD-10-CM | POA: Diagnosis not present

## 2021-05-30 DIAGNOSIS — M542 Cervicalgia: Secondary | ICD-10-CM | POA: Diagnosis not present

## 2021-05-30 DIAGNOSIS — M47812 Spondylosis without myelopathy or radiculopathy, cervical region: Secondary | ICD-10-CM | POA: Diagnosis not present

## 2021-06-27 DIAGNOSIS — Z23 Encounter for immunization: Secondary | ICD-10-CM | POA: Diagnosis not present

## 2021-06-27 DIAGNOSIS — E039 Hypothyroidism, unspecified: Secondary | ICD-10-CM | POA: Diagnosis not present

## 2021-07-05 DIAGNOSIS — H35033 Hypertensive retinopathy, bilateral: Secondary | ICD-10-CM | POA: Diagnosis not present

## 2021-07-05 DIAGNOSIS — H527 Unspecified disorder of refraction: Secondary | ICD-10-CM | POA: Diagnosis not present

## 2021-07-05 DIAGNOSIS — H16223 Keratoconjunctivitis sicca, not specified as Sjogren's, bilateral: Secondary | ICD-10-CM | POA: Diagnosis not present

## 2021-07-05 DIAGNOSIS — H26493 Other secondary cataract, bilateral: Secondary | ICD-10-CM | POA: Diagnosis not present

## 2021-07-10 DIAGNOSIS — B338 Other specified viral diseases: Secondary | ICD-10-CM | POA: Diagnosis not present

## 2021-07-10 DIAGNOSIS — J988 Other specified respiratory disorders: Secondary | ICD-10-CM | POA: Diagnosis not present

## 2021-07-10 DIAGNOSIS — Z03818 Encounter for observation for suspected exposure to other biological agents ruled out: Secondary | ICD-10-CM | POA: Diagnosis not present

## 2021-07-10 DIAGNOSIS — R051 Acute cough: Secondary | ICD-10-CM | POA: Diagnosis not present

## 2021-08-21 DIAGNOSIS — R590 Localized enlarged lymph nodes: Secondary | ICD-10-CM | POA: Diagnosis not present

## 2021-08-21 DIAGNOSIS — J029 Acute pharyngitis, unspecified: Secondary | ICD-10-CM | POA: Diagnosis not present

## 2021-08-21 DIAGNOSIS — H9201 Otalgia, right ear: Secondary | ICD-10-CM | POA: Diagnosis not present

## 2022-01-01 DIAGNOSIS — M1711 Unilateral primary osteoarthritis, right knee: Secondary | ICD-10-CM | POA: Diagnosis not present

## 2022-02-05 DIAGNOSIS — J069 Acute upper respiratory infection, unspecified: Secondary | ICD-10-CM | POA: Diagnosis not present

## 2022-02-05 DIAGNOSIS — R350 Frequency of micturition: Secondary | ICD-10-CM | POA: Diagnosis not present

## 2022-02-26 DIAGNOSIS — Z1231 Encounter for screening mammogram for malignant neoplasm of breast: Secondary | ICD-10-CM | POA: Diagnosis not present

## 2022-02-26 DIAGNOSIS — M545 Low back pain, unspecified: Secondary | ICD-10-CM | POA: Diagnosis not present

## 2022-04-29 DIAGNOSIS — I10 Essential (primary) hypertension: Secondary | ICD-10-CM | POA: Diagnosis not present

## 2022-04-29 DIAGNOSIS — E559 Vitamin D deficiency, unspecified: Secondary | ICD-10-CM | POA: Diagnosis not present

## 2022-04-29 DIAGNOSIS — G3184 Mild cognitive impairment, so stated: Secondary | ICD-10-CM | POA: Diagnosis not present

## 2022-04-29 DIAGNOSIS — Z0000 Encounter for general adult medical examination without abnormal findings: Secondary | ICD-10-CM | POA: Diagnosis not present

## 2022-04-29 DIAGNOSIS — E890 Postprocedural hypothyroidism: Secondary | ICD-10-CM | POA: Diagnosis not present

## 2022-04-29 DIAGNOSIS — T466X5A Adverse effect of antihyperlipidemic and antiarteriosclerotic drugs, initial encounter: Secondary | ICD-10-CM | POA: Diagnosis not present

## 2022-04-29 DIAGNOSIS — Z1331 Encounter for screening for depression: Secondary | ICD-10-CM | POA: Diagnosis not present

## 2022-04-29 DIAGNOSIS — E7800 Pure hypercholesterolemia, unspecified: Secondary | ICD-10-CM | POA: Diagnosis not present

## 2022-07-08 DIAGNOSIS — H26493 Other secondary cataract, bilateral: Secondary | ICD-10-CM | POA: Diagnosis not present

## 2022-07-08 DIAGNOSIS — H35033 Hypertensive retinopathy, bilateral: Secondary | ICD-10-CM | POA: Diagnosis not present

## 2022-07-08 DIAGNOSIS — Z961 Presence of intraocular lens: Secondary | ICD-10-CM | POA: Diagnosis not present

## 2022-08-26 DIAGNOSIS — H26492 Other secondary cataract, left eye: Secondary | ICD-10-CM | POA: Diagnosis not present

## 2022-09-13 DIAGNOSIS — M25561 Pain in right knee: Secondary | ICD-10-CM | POA: Diagnosis not present

## 2022-09-13 DIAGNOSIS — M170 Bilateral primary osteoarthritis of knee: Secondary | ICD-10-CM | POA: Diagnosis not present

## 2022-09-17 DIAGNOSIS — H26491 Other secondary cataract, right eye: Secondary | ICD-10-CM | POA: Diagnosis not present

## 2022-10-15 DIAGNOSIS — I8393 Asymptomatic varicose veins of bilateral lower extremities: Secondary | ICD-10-CM | POA: Diagnosis not present

## 2022-10-17 ENCOUNTER — Other Ambulatory Visit: Payer: Self-pay | Admitting: *Deleted

## 2022-10-17 DIAGNOSIS — I8393 Asymptomatic varicose veins of bilateral lower extremities: Secondary | ICD-10-CM

## 2022-10-22 DIAGNOSIS — M1711 Unilateral primary osteoarthritis, right knee: Secondary | ICD-10-CM | POA: Diagnosis not present

## 2022-10-28 ENCOUNTER — Ambulatory Visit (HOSPITAL_COMMUNITY)
Admission: RE | Admit: 2022-10-28 | Discharge: 2022-10-28 | Disposition: A | Discharge status: Home / Self Care | Payer: Medicare HMO | Source: Ambulatory Visit | Attending: Surgery | Admitting: Surgery

## 2022-10-28 DIAGNOSIS — I8393 Asymptomatic varicose veins of bilateral lower extremities: Secondary | ICD-10-CM | POA: Diagnosis not present

## 2022-10-28 NOTE — Progress Notes (Unsigned)
[facility] [place]  ASSESSMENT / PLAN: LEEASIA SECRIST is a 81 y.o. 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  Venous duplex is significant for deep reflux and reflux involving part of the greater saphenous vein.  Recommend compression and elevation for symptomatic relief. Follow up in three months to discuss intervention for spontaneous bleeding. Sooner if she should develop another bleeding episode.  CHIEF COMPLAINT: Spontaneous bleeding from left lateral ankle  HISTORY OF PRESENT ILLNESS: DONNAH LEVERT is a 81 y.o. female who presents to clinic for evaluation of spontaneous bleeding from the left lateral ankle occurring several days prior to presentation.  The patient reports she was getting ready to take a shower when she noticed profuse bleeding from the ankle.  She called emergency medical services and was ultimately able to get control of bleeding in the house without a visit to the ER.  She has kept the wound bandaged since that time.  She has never had an episode like this before.  She does have prominent reticular and varicose veins throughout bilateral lower extremities which were not previously bothersome to her.  Past Medical History:  Diagnosis Date   Adenomatous colon polyp    Allergy    Arthritis    Bronchitis, asthmatic    Carotid artery disease ([place]) 04/03/2018   Carotid US 03/18/18- Bilateral ICA 40-59   Diverticulosis    DJD (degenerative joint disease)    Hypercholesterolemia    Hypertension    Hypothyroidism    Osteopenia    S/P aortic valve replacement with bioprosthetic valve 03/20/2018   2/2 Aortic Stenosis // 23 mm Edwards Intuity Elite rapid deployment stented bovine pericardial tissue valve // Echo 6/19: Mild concentric LVH, EF 60-65, normal wall motion, grade 1 diastolic dysfunction, normally functioning bioprosthetic AVR  (mean 10, peak 19), mild to moderate MR, mild LAE, moderate TR, PASP 35    Vitamin D deficiency     Past Surgical History:  Procedure Laterality Date   AORTIC VALVE REPLACEMENT N/A 03/20/2018   Procedure: AORTIC VALVE REPLACEMENT (AVR);  Surgeon: Rexene Alberts, MD;  Location: [facility];  Service: Open Heart Surgery;  Laterality: N/A;   BREAST SURGERY Right    x 2   COLONOSCOPY W/ POLYPECTOMY     FINGER SURGERY Right    index finger   MULTIPLE TOOTH EXTRACTIONS     RIGHT/LEFT HEART CATH AND CORONARY ANGIOGRAPHY N/A 02/16/2018   Procedure: RIGHT/LEFT HEART CATH AND CORONARY ANGIOGRAPHY;  Surgeon: Martinique, Peter M, MD;  Location: [facility];  Service: Cardiovascular;  Laterality: N/A;   TEE WITHOUT CARDIOVERSION N/A 03/20/2018   Procedure: TRANSESOPHAGEAL ECHOCARDIOGRAM (TEE);  Surgeon: Rexene Alberts, MD;  Location: [facility];  Service: Open Heart Surgery;  Laterality: N/A;   TONSILLECTOMY     TOTAL HIP ARTHROPLASTY Right 09/06/2013   Procedure: TOTAL HIP ARTHROPLASTY;  Surgeon: Kerin Salen, MD;  Location: [facility];  Service: Orthopedics;  Laterality: Right;    Family History  Problem Relation Age of Onset   Ovarian cancer Sister    Colon polyps Brother     Social History   Socioeconomic History   Marital status: Married    Spouse name: Not on file   Number of children: Not on file   Years of education: Not on file   Highest education level: Not on file  Occupational History   Occupation: RETIRED  Tobacco  Use   Smoking status: Never   Smokeless tobacco: Never  Vaping Use   Vaping Use: Never used  Substance and Sexual Activity   Alcohol use: Yes    Alcohol/week: 2.0 standard drinks of alcohol    Types: 2 Glasses of wine per week    Comment: occasional   Drug use: No   Sexual activity: Not on file  Other Topics Concern   Not on file  Social History Narrative   Pt is right handed   Lives in 3 story home with her husband and son   Has 2 adult children   High school  graduate   Social Determinants of Health   Financial Resource Strain: Not on file  Food Insecurity: Not on file  Transportation Needs: Not on file  Physical Activity: Not on file  Stress: Not on file  Social Connections: Not on file  Intimate Partner Violence: Not on file    Allergies  Allergen Reactions   Fenofibrate Other (See Comments)    LEG ACHES, 09/2012, SIDE EFFECTS   Keflex [Cephalexin] Other (See Comments)    LEG ACHES, 09/2012, SIDE EFFECTS   Livalo [Pitavastatin] Other (See Comments)    LEG ACHES   Lovastatin Other (See Comments)    LEG ACHES, SIDE EFFECTS   Pravastatin Other (See Comments)    LEG ACHES, SIDE EFFECTS    Current Outpatient Medications  Medication Sig Dispense Refill   amoxicillin (AMOXIL) 500 MG capsule Take 2,000 mg by mouth as directed. Prior to dental procedures      aspirin EC 81 MG tablet Take 81 mg by mouth daily.     Calcium Carbonate-Vitamin D (CALCIUM 500 + D PO) Take 2 tablets by mouth daily.     Cholecalciferol (VITAMIN D) 2000 units CAPS Take 2,000 Units by mouth daily.     FIBER SELECT GUMMIES CHEW Chew 1 tablet by mouth daily.      ibuprofen (ADVIL,MOTRIN) 200 MG tablet Take 200-400 mg by mouth daily as needed for headache or moderate pain.      levothyroxine (SYNTHROID, LEVOTHROID) 100 MCG tablet Take 100 mcg by mouth daily before breakfast.     lisinopril (PRINIVIL,ZESTRIL) 2.5 MG tablet Take 1 tablet (2.5 mg total) by mouth daily. 90 tablet 3   metoprolol tartrate (LOPRESSOR) 25 MG tablet Take 0.5 tablets (12.5 mg total) by mouth 2 (two) times daily. 90 tablet 3   Multiple Vitamins-Minerals (MULTIVITAMIN PO) Take 1 tablet by mouth daily.     No current facility-administered medications for this visit.    PHYSICAL EXAM There were no vitals filed for this visit.  Well-appearing elderly woman in no acute distress Regular rate and rhythm Unlabored breathing 2+ dorsalis pedis pulses bilaterally Corona phlebectatica bilaterally.  Eschar about left lateral ankle. [place] varicosities and reticular veins about bilateral lower extremities  PERTINENT LABORATORY AND RADIOLOGIC DATA  Most recent CBC    Latest Ref Rng & Units 04/03/2018   11:56 AM 03/23/2018    2:44 AM 03/22/2018    3:48 AM  CBC  WBC 3.4 - 10.8 x10E3/uL 9.2  8.6  11.1   Hemoglobin 11.1 - 15.9 g/dL 10.7  8.8  8.7   Hematocrit 34.0 - 46.6 % 31.6  26.7  26.9   Platelets 150 - 379 x10E3/uL 482  148  133      Most recent CMP    Latest Ref Rng & Units 04/03/2018   11:56 AM 03/24/2018    3:19 AM 03/23/2018  2:44 AM  CMP  Glucose 65 - 99 mg/dL 92  97  104   BUN 8 - 27 mg/dL '17  13  12   '$ Creatinine 0.57 - 1.00 mg/dL 0.73  0.50  0.58   Sodium 134 - 144 mmol/L 139  139  137   Potassium 3.5 - 5.2 mmol/L 4.5  4.0  3.3   Chloride 96 - 106 mmol/L 99  104  104   CO2 20 - 29 mmol/L '27  26  25   '$ Calcium 8.7 - 10.3 mg/dL 10.2  8.6  8.4    Left lower extremity venous reflux study  - No evidence of deep vein thrombosis seen in the left lower extremity,  from the common femoral through the popliteal veins.  - No evidence of superficial venous reflux seen in the left short  saphenous vein.  - Venous reflux is noted in the left greater saphenous vein in the distal  thigh.  - Venous reflux is noted in the left greater saphenous vein in the calf.  - Venous reflux is noted in the left femoral vein.   - No evidence of venous reflux in the AASV.   Yevonne Aline. Stanford Breed, MD FACS [facility] Phone Number: [phone] 10/28/2022 6:17 PM   Total time spent on preparing this encounter including chart review, data review, collecting history, examining the patient, coordinating care for this new patient, 45 minutes.  Portions of this report may have been transcribed using voice recognition software.  Every effort has been made to ensure accuracy; however, inadvertent computerized transcription errors may still be present.

## 2022-10-29 ENCOUNTER — Ambulatory Visit: Payer: Medicare HMO | Admitting: Vascular Surgery

## 2022-10-29 ENCOUNTER — Encounter: Payer: Self-pay | Admitting: Vascular Surgery

## 2022-10-29 VITALS — BP 160/68 | HR 71 | Temp 98.9°F | Resp 20 | Ht 63.5 in | Wt 139.0 lb

## 2022-10-29 DIAGNOSIS — I872 Venous insufficiency (chronic) (peripheral): Secondary | ICD-10-CM | POA: Diagnosis not present

## 2022-10-29 DIAGNOSIS — I8393 Asymptomatic varicose veins of bilateral lower extremities: Secondary | ICD-10-CM

## 2022-10-29 DIAGNOSIS — M1711 Unilateral primary osteoarthritis, right knee: Secondary | ICD-10-CM | POA: Diagnosis not present

## 2022-11-04 ENCOUNTER — Encounter (HOSPITAL_COMMUNITY): Payer: Medicare HMO

## 2022-11-05 DIAGNOSIS — M1711 Unilateral primary osteoarthritis, right knee: Secondary | ICD-10-CM | POA: Diagnosis not present

## 2022-11-28 DIAGNOSIS — M1711 Unilateral primary osteoarthritis, right knee: Secondary | ICD-10-CM | POA: Diagnosis not present

## 2023-01-29 ENCOUNTER — Ambulatory Visit: Payer: Medicare HMO | Admitting: Vascular Surgery

## 2023-01-29 ENCOUNTER — Telehealth: Payer: Self-pay

## 2023-01-29 ENCOUNTER — Encounter: Payer: Self-pay | Admitting: Vascular Surgery

## 2023-01-29 VITALS — BP 153/72 | HR 60 | Temp 99.5°F | Resp 16 | Ht 63.0 in | Wt 139.7 lb

## 2023-01-29 DIAGNOSIS — I8393 Asymptomatic varicose veins of bilateral lower extremities: Secondary | ICD-10-CM | POA: Diagnosis not present

## 2023-01-29 DIAGNOSIS — I872 Venous insufficiency (chronic) (peripheral): Secondary | ICD-10-CM | POA: Diagnosis not present

## 2023-01-29 NOTE — Progress Notes (Signed)
REASON FOR VISIT:   Follow-up of chronic venous insufficiency  MEDICAL ISSUES:   CHRONIC VENOUS INSUFFICIENCY: This patient has had bleeding from some telangiectasias in her lateral left leg.  She has some deep venous reflux with really no significant superficial venous reflux.  We have discussed importance of leg elevation and the proper positioning for this.  I have encouraged her to continue to wear her compression stockings.  We discussed the importance of exercise specifically walking and water aerobics.  I encouraged her to avoid prolonged sitting and standing.  We also discussed what to do if she did bleed again.  She knows to elevate her leg above her heart for provide direct pressure over the area.  I think she would benefit from sclerotherapy in order to lower her risk of future bleeding.  Estell Harpin, RN was able to look at the wounds today and agrees that she would be a good candidate.  We will try to arrange this in the near future.  I will see her back as needed.  HPI:   Cheyenne Jones is a pleasant 82 y.o. female who was seen by Dr. Stanford Breed on 10/29/2022.  She had spontaneous bleeding from a left lateral ankle wound several days prior to that visit.  Since that visit, she has had no further bleeding episodes.  She does describe some aching pain and heaviness in the left leg which is aggravated by sitting and standing and relieved with elevation.  She has had no previous history of DVT and no previous venous procedures.  Past Medical History:  Diagnosis Date   Adenomatous colon polyp    Allergy    Arthritis    Bronchitis, asthmatic    Carotid artery disease ([place]) 04/03/2018   Carotid US 03/18/18- Bilateral ICA 40-59   Diverticulosis    DJD (degenerative joint disease)    Hypercholesterolemia    Hypertension    Hypothyroidism    Osteopenia    S/P aortic valve replacement with bioprosthetic valve 03/20/2018   2/2 Aortic Stenosis // 23 mm Edwards Intuity Elite rapid  deployment stented bovine pericardial tissue valve // Echo 6/19: Mild concentric LVH, EF 60-65, normal wall motion, grade 1 diastolic dysfunction, normally functioning bioprosthetic AVR (mean 10, peak 19), mild to moderate MR, mild LAE, moderate TR, PASP 35    Vitamin D deficiency     Family History  Problem Relation Age of Onset   Ovarian cancer Sister    Colon polyps Brother     SOCIAL HISTORY: Social History   Tobacco Use   Smoking status: Never   Smokeless tobacco: Never  Substance Use Topics   Alcohol use: Yes    Alcohol/week: 2.0 standard drinks of alcohol    Types: 2 Glasses of wine per week    Comment: occasional    Allergies  Allergen Reactions   Fenofibrate Other (See Comments)    LEG ACHES, 09/2012, SIDE EFFECTS   Keflex [Cephalexin] Other (See Comments)    LEG ACHES, 09/2012, SIDE EFFECTS   Livalo [Pitavastatin] Other (See Comments)    LEG ACHES   Lovastatin Other (See Comments)    LEG ACHES, SIDE EFFECTS   Pravastatin Other (See Comments)    LEG ACHES, SIDE EFFECTS    Current Outpatient Medications  Medication Sig Dispense Refill   amoxicillin (AMOXIL) 500 MG capsule Take 2,000 mg by mouth as directed. Prior to dental procedures      aspirin EC 81 MG tablet Take 81 mg by mouth  daily.     Calcium Carbonate-Vitamin D (CALCIUM 500 + D PO) Take 2 tablets by mouth daily.     Cholecalciferol (VITAMIN D) 2000 units CAPS Take 2,000 Units by mouth daily.     FIBER SELECT GUMMIES CHEW Chew 1 tablet by mouth daily.      ibuprofen (ADVIL,MOTRIN) 200 MG tablet Take 200-400 mg by mouth daily as needed for headache or moderate pain.      levothyroxine (SYNTHROID) 88 MCG tablet Take 88 mcg by mouth every morning.     lisinopril (PRINIVIL,ZESTRIL) 2.5 MG tablet Take 1 tablet (2.5 mg total) by mouth daily. 90 tablet 3   metoprolol tartrate (LOPRESSOR) 25 MG tablet Take 0.5 tablets (12.5 mg total) by mouth 2 (two) times daily. 90 tablet 3   Multiple Vitamins-Minerals  (MULTIVITAMIN PO) Take 1 tablet by mouth daily.     No current facility-administered medications for this visit.    REVIEW OF SYSTEMS:  '[X]'$  denotes positive finding, '[ ]'$  denotes negative finding Cardiac  Comments:  Chest pain or chest pressure:    Shortness of breath upon exertion:    Short of breath when lying flat:    Irregular heart rhythm:        Vascular    Pain in calf, thigh, or hip brought on by ambulation:    Pain in feet at night that wakes you up from your sleep:     Blood clot in your veins:    Leg swelling:         Pulmonary    Oxygen at home:    Productive cough:     Wheezing:         Neurologic    Sudden weakness in arms or legs:     Sudden numbness in arms or legs:     Sudden onset of difficulty speaking or slurred speech:    Temporary loss of vision in one eye:     Problems with dizziness:         Gastrointestinal    Blood in stool:     Vomited blood:         Genitourinary    Burning when urinating:     Blood in urine:        Psychiatric    Major depression:         Hematologic    Bleeding problems:    Problems with blood clotting too easily:        Skin    Rashes or ulcers:        Constitutional    Fever or chills:     PHYSICAL EXAM:   Vitals:   01/29/23 1322  BP: (!) 153/72  Pulse: 60  Resp: 16  Temp: 99.5 F (37.5 C)  TempSrc: Temporal  SpO2: 97%  Weight: 139 lb 11.2 oz (63.4 kg)  Height: '5\' 3"'$  (1.6 m)    GENERAL: The patient is a well-nourished female, in no acute distress. The vital signs are documented above. CARDIAC: There is a regular rate and rhythm.  VASCULAR: I do not detect carotid bruits. She has palpable pedal pulses. I did look at her left great saphenous vein myself with the SonoSite and she has minimal reflux in the vein is not dilated. PULMONARY: There is good air exchange bilaterally without wheezing or rales. ABDOMEN: Soft and non-tender with normal pitched bowel sounds.  MUSCULOSKELETAL: There are no major  deformities or cyanosis. NEUROLOGIC: No focal weakness or paresthesias are detected. SKIN: She has corona  phlebectatica of the left foot is noted in the photographs below.     PSYCHIATRIC: The patient has a normal affect.  DATA:    VENOUS DUPLEX: I reviewed her venous duplex scan that was done on 10/28/2022.  This showed no evidence of DVT on the left.  The study was of the left leg only.  There was deep venous reflux in the femoral vein only.  There was a segment of superficial venous reflux in the great saphenous vein from the distal thigh to the proximal calf however the vein was fairly small with diameters ranging from 2.6-3.2 mm.  The results of the study are summarized in the diagram below.    Deitra Mayo [facility] [phone]

## 2023-01-29 NOTE — Telephone Encounter (Signed)
Spoke with both [employer] and [employer] in an attempt to initiate pre auth for sclerotherapy for pt's bleeding vein. Clinicals are faxed over and will f/u with pt once we hear back from [employer].

## 2023-02-01 DIAGNOSIS — J029 Acute pharyngitis, unspecified: Secondary | ICD-10-CM | POA: Diagnosis not present

## 2023-02-01 DIAGNOSIS — B349 Viral infection, unspecified: Secondary | ICD-10-CM | POA: Diagnosis not present

## 2023-02-04 ENCOUNTER — Telehealth: Payer: Self-pay

## 2023-02-04 NOTE — Telephone Encounter (Signed)
Attempted to reach pt to schedule sclerotherapy appt. LVM to call back.

## 2023-02-07 ENCOUNTER — Telehealth: Payer: Self-pay

## 2023-02-07 NOTE — Telephone Encounter (Signed)
Pt was approved for 2 units of sclerotherapy from 02/28/23-05/31/23. Pt is aware of this and does not wish to schedule at this time and will call back closer to April if she wishes to proceed.

## 2023-03-11 DIAGNOSIS — Z1231 Encounter for screening mammogram for malignant neoplasm of breast: Secondary | ICD-10-CM | POA: Diagnosis not present

## 2023-05-05 DIAGNOSIS — G3184 Mild cognitive impairment, so stated: Secondary | ICD-10-CM | POA: Diagnosis not present

## 2023-05-05 DIAGNOSIS — Z1331 Encounter for screening for depression: Secondary | ICD-10-CM | POA: Diagnosis not present

## 2023-05-05 DIAGNOSIS — E7800 Pure hypercholesterolemia, unspecified: Secondary | ICD-10-CM | POA: Diagnosis not present

## 2023-05-05 DIAGNOSIS — Z0000 Encounter for general adult medical examination without abnormal findings: Secondary | ICD-10-CM | POA: Diagnosis not present

## 2023-05-05 DIAGNOSIS — E559 Vitamin D deficiency, unspecified: Secondary | ICD-10-CM | POA: Diagnosis not present

## 2023-05-05 DIAGNOSIS — E890 Postprocedural hypothyroidism: Secondary | ICD-10-CM | POA: Diagnosis not present

## 2023-05-05 DIAGNOSIS — T466X5A Adverse effect of antihyperlipidemic and antiarteriosclerotic drugs, initial encounter: Secondary | ICD-10-CM | POA: Diagnosis not present

## 2023-05-05 DIAGNOSIS — I10 Essential (primary) hypertension: Secondary | ICD-10-CM | POA: Diagnosis not present

## 2023-09-16 DIAGNOSIS — H16223 Keratoconjunctivitis sicca, not specified as Sjogren's, bilateral: Secondary | ICD-10-CM | POA: Diagnosis not present

## 2023-09-16 DIAGNOSIS — H35033 Hypertensive retinopathy, bilateral: Secondary | ICD-10-CM | POA: Diagnosis not present

## 2023-09-16 DIAGNOSIS — Z961 Presence of intraocular lens: Secondary | ICD-10-CM | POA: Diagnosis not present
